# Patient Record
Sex: Female | Born: 1935 | Race: White | Hispanic: No | Smoking: Never smoker
Health system: Southern US, Community
[De-identification: ages and names within clinical notes are randomized; demographics above are authoritative.]

## PROBLEM LIST (undated history)

## (undated) DIAGNOSIS — E079 Disorder of thyroid, unspecified: Secondary | ICD-10-CM

## (undated) DIAGNOSIS — R296 Repeated falls: Secondary | ICD-10-CM

## (undated) DIAGNOSIS — E876 Hypokalemia: Secondary | ICD-10-CM

## (undated) DIAGNOSIS — R41841 Cognitive communication deficit: Secondary | ICD-10-CM

## (undated) DIAGNOSIS — F0390 Unspecified dementia without behavioral disturbance: Secondary | ICD-10-CM

## (undated) DIAGNOSIS — I739 Peripheral vascular disease, unspecified: Secondary | ICD-10-CM

## (undated) DIAGNOSIS — K219 Gastro-esophageal reflux disease without esophagitis: Secondary | ICD-10-CM

## (undated) DIAGNOSIS — F0280 Dementia in other diseases classified elsewhere without behavioral disturbance: Secondary | ICD-10-CM

## (undated) DIAGNOSIS — G309 Alzheimer's disease, unspecified: Secondary | ICD-10-CM

## (undated) DIAGNOSIS — R279 Unspecified lack of coordination: Secondary | ICD-10-CM

## (undated) DIAGNOSIS — N390 Urinary tract infection, site not specified: Secondary | ICD-10-CM

## (undated) DIAGNOSIS — F329 Major depressive disorder, single episode, unspecified: Secondary | ICD-10-CM

## (undated) DIAGNOSIS — T148XXA Other injury of unspecified body region, initial encounter: Secondary | ICD-10-CM

## (undated) DIAGNOSIS — R1310 Dysphagia, unspecified: Secondary | ICD-10-CM

## (undated) HISTORY — PX: HEMORROIDECTOMY: SUR656

---

## 2000-08-03 ENCOUNTER — Ambulatory Visit (HOSPITAL_COMMUNITY): Admission: RE | Admit: 2000-08-03 | Discharge: 2000-08-03 | Payer: Self-pay | Admitting: Internal Medicine

## 2000-08-03 ENCOUNTER — Encounter: Payer: Self-pay | Admitting: Internal Medicine

## 2000-10-01 ENCOUNTER — Other Ambulatory Visit: Admission: RE | Admit: 2000-10-01 | Discharge: 2000-10-01 | Payer: Self-pay | Admitting: Dermatology

## 2001-04-09 ENCOUNTER — Other Ambulatory Visit: Admission: RE | Admit: 2001-04-09 | Discharge: 2001-04-09 | Payer: Self-pay | Admitting: Internal Medicine

## 2001-04-10 ENCOUNTER — Encounter: Payer: Self-pay | Admitting: Internal Medicine

## 2001-04-10 ENCOUNTER — Ambulatory Visit (HOSPITAL_COMMUNITY): Admission: RE | Admit: 2001-04-10 | Discharge: 2001-04-10 | Payer: Self-pay | Admitting: Internal Medicine

## 2001-04-18 ENCOUNTER — Encounter: Payer: Self-pay | Admitting: Internal Medicine

## 2001-04-18 ENCOUNTER — Ambulatory Visit (HOSPITAL_COMMUNITY): Admission: RE | Admit: 2001-04-18 | Discharge: 2001-04-18 | Payer: Self-pay | Admitting: Internal Medicine

## 2001-05-02 ENCOUNTER — Encounter: Payer: Self-pay | Admitting: Internal Medicine

## 2001-05-02 ENCOUNTER — Ambulatory Visit (HOSPITAL_COMMUNITY): Admission: RE | Admit: 2001-05-02 | Discharge: 2001-05-02 | Payer: Self-pay | Admitting: Internal Medicine

## 2001-05-09 ENCOUNTER — Encounter (HOSPITAL_COMMUNITY): Admission: RE | Admit: 2001-05-09 | Discharge: 2001-06-08 | Payer: Self-pay | Admitting: Rheumatology

## 2001-06-05 ENCOUNTER — Ambulatory Visit (HOSPITAL_COMMUNITY): Admission: RE | Admit: 2001-06-05 | Discharge: 2001-06-05 | Payer: Self-pay | Admitting: Internal Medicine

## 2001-06-11 ENCOUNTER — Ambulatory Visit (HOSPITAL_COMMUNITY): Admission: RE | Admit: 2001-06-11 | Discharge: 2001-06-11 | Payer: Self-pay | Admitting: Internal Medicine

## 2001-08-05 ENCOUNTER — Encounter: Payer: Self-pay | Admitting: Internal Medicine

## 2001-08-05 ENCOUNTER — Ambulatory Visit (HOSPITAL_COMMUNITY): Admission: RE | Admit: 2001-08-05 | Discharge: 2001-08-05 | Payer: Self-pay | Admitting: Internal Medicine

## 2002-04-02 ENCOUNTER — Observation Stay (HOSPITAL_COMMUNITY): Admission: RE | Admit: 2002-04-02 | Discharge: 2002-04-03 | Payer: Self-pay | Admitting: Internal Medicine

## 2002-08-06 ENCOUNTER — Encounter: Payer: Self-pay | Admitting: Internal Medicine

## 2002-08-06 ENCOUNTER — Ambulatory Visit (HOSPITAL_COMMUNITY): Admission: RE | Admit: 2002-08-06 | Discharge: 2002-08-06 | Payer: Self-pay | Admitting: Internal Medicine

## 2002-09-26 ENCOUNTER — Ambulatory Visit (HOSPITAL_COMMUNITY): Admission: RE | Admit: 2002-09-26 | Discharge: 2002-09-26 | Payer: Self-pay | Admitting: Pulmonary Disease

## 2003-08-06 ENCOUNTER — Ambulatory Visit (HOSPITAL_COMMUNITY): Admission: RE | Admit: 2003-08-06 | Discharge: 2003-08-06 | Payer: Self-pay | Admitting: Family Medicine

## 2004-07-26 ENCOUNTER — Ambulatory Visit: Payer: Self-pay | Admitting: Internal Medicine

## 2004-08-08 ENCOUNTER — Ambulatory Visit (HOSPITAL_COMMUNITY): Admission: RE | Admit: 2004-08-08 | Discharge: 2004-08-08 | Payer: Self-pay | Admitting: Internal Medicine

## 2005-08-10 ENCOUNTER — Ambulatory Visit (HOSPITAL_COMMUNITY): Admission: RE | Admit: 2005-08-10 | Discharge: 2005-08-10 | Payer: Self-pay | Admitting: Pulmonary Disease

## 2005-08-15 ENCOUNTER — Ambulatory Visit: Payer: Self-pay | Admitting: Internal Medicine

## 2005-08-22 ENCOUNTER — Ambulatory Visit (HOSPITAL_COMMUNITY): Admission: RE | Admit: 2005-08-22 | Discharge: 2005-08-22 | Payer: Self-pay | Admitting: Pulmonary Disease

## 2006-01-06 ENCOUNTER — Emergency Department (HOSPITAL_COMMUNITY): Admission: EM | Admit: 2006-01-06 | Discharge: 2006-01-06 | Payer: Self-pay | Admitting: Emergency Medicine

## 2006-10-16 ENCOUNTER — Ambulatory Visit (HOSPITAL_COMMUNITY): Admission: RE | Admit: 2006-10-16 | Discharge: 2006-10-16 | Payer: Self-pay | Admitting: Pulmonary Disease

## 2006-11-26 ENCOUNTER — Ambulatory Visit: Payer: Self-pay | Admitting: Internal Medicine

## 2006-12-17 ENCOUNTER — Encounter: Payer: Self-pay | Admitting: Internal Medicine

## 2006-12-17 ENCOUNTER — Ambulatory Visit (HOSPITAL_COMMUNITY): Admission: RE | Admit: 2006-12-17 | Discharge: 2006-12-17 | Payer: Self-pay | Admitting: Internal Medicine

## 2006-12-17 ENCOUNTER — Ambulatory Visit: Payer: Self-pay | Admitting: Internal Medicine

## 2007-03-29 ENCOUNTER — Ambulatory Visit (HOSPITAL_COMMUNITY): Admission: RE | Admit: 2007-03-29 | Discharge: 2007-03-29 | Payer: Self-pay | Admitting: Pulmonary Disease

## 2007-04-05 ENCOUNTER — Ambulatory Visit (HOSPITAL_COMMUNITY): Admission: RE | Admit: 2007-04-05 | Discharge: 2007-04-05 | Payer: Self-pay | Admitting: Pulmonary Disease

## 2007-10-22 ENCOUNTER — Ambulatory Visit (HOSPITAL_COMMUNITY): Admission: RE | Admit: 2007-10-22 | Discharge: 2007-10-22 | Payer: Self-pay | Admitting: Pulmonary Disease

## 2008-03-02 ENCOUNTER — Ambulatory Visit (HOSPITAL_COMMUNITY): Admission: RE | Admit: 2008-03-02 | Discharge: 2008-03-02 | Payer: Self-pay | Admitting: Pulmonary Disease

## 2008-03-16 ENCOUNTER — Ambulatory Visit (HOSPITAL_COMMUNITY): Admission: RE | Admit: 2008-03-16 | Discharge: 2008-03-16 | Payer: Self-pay | Admitting: Pulmonary Disease

## 2008-10-26 ENCOUNTER — Ambulatory Visit (HOSPITAL_COMMUNITY): Admission: RE | Admit: 2008-10-26 | Discharge: 2008-10-26 | Payer: Self-pay | Admitting: Pulmonary Disease

## 2009-04-12 ENCOUNTER — Encounter: Payer: Self-pay | Admitting: Urgent Care

## 2009-10-28 ENCOUNTER — Ambulatory Visit (HOSPITAL_COMMUNITY): Admission: RE | Admit: 2009-10-28 | Discharge: 2009-10-28 | Payer: Self-pay | Admitting: Pulmonary Disease

## 2010-02-28 ENCOUNTER — Encounter: Payer: Self-pay | Admitting: Pulmonary Disease

## 2010-03-08 NOTE — Medication Information (Signed)
Summary: Tax adviser   Imported By: Diana Eves 04/12/2009 13:08:08  _____________________________________________________________________  External Attachment:    Type:   Image     Comment:   External Document  Appended Document: RX Folder Pt not seen in >2 yrs.  Needs OV 1st or can get from PCP.  Appended Document: RX Folder pt says that she has already gotten her meds and doesn't want to make an appt.

## 2010-06-21 NOTE — H&P (Signed)
NAMEJERIE, BASFORD                 ACCOUNT NO.:  [id number]   MEDICAL RECORD NO.:  [id number]         PATIENT TYPE:  PAMB   LOCATION:  day                           FACILITY:  [facility]   PHYSICIAN:  R. Roetta Sessions, M.D. DATE OF BIRTH:  [birth year]   DATE OF ADMISSION:  DATE OF DISCHARGE:                              HISTORY & PHYSICAL   CHIEF COMPLAINT:  Need high risk screening colonoscopy and family  history of colon cancer; Sore in right buttock area.   Ms. Belvia Gotschall. Leicht is a very pleasant 77 year old Caucasian female with  a positive family history of colon cancer in multiple first degree  relatives who is here to set up high risk screening exam.  She tells me  she has noted a problem with a red painful sore area in her right  buttock area which has been present for the past week.  She does not  recall any trauma or bug bites, etc.  She has not had any melena or  rectal bleeding. Bowel function described as normal.  She last underwent  a colonoscopy in 2003 which demonstrated only internal hemorrhoids.  She  has a history of gastroesophageal reflux disease, nonspecific motility  disorder.  She has been maintained on omeprazole 20 mg b.i.d. with good  control of symptoms more recently.  She had gained to 149-1/2 pounds in  July of last year when she was seen and now weighs 144 pounds.  She does  not have any early satiety, nausea, vomiting, odynophagia or dysphagia.  She really denies any abdominal pain.   PAST MEDICAL HISTORY:  1. Significant for gastroesophageal reflux disease.  2. Nonspecific esophageal motility disorder.  3. History of hypothyroidism.  4. Seasonal allergies.   PAST SURGICAL HISTORY:  1. Endometriosis status post bilateral salpingo-oophorectomy.  2. Hysterectomy.  3. Hemorrhoidectomy.  4. Thyroid surgery.  5. Bladder repair.   She was evaluated for dyspnea by Dr. Valera Castle with no significant  coronary artery disease found.   CURRENT MEDICATIONS:  1. Synthroid 88 mcg daily.  2. Aspirin 81 mg daily.  3. Ocuvite two tablets b.i.d.  4. Vitamin D daily.  5. Indocin 25 mg t.i.d.  6. Hydrochlorothiazide 50 mg daily.  7. Omeprazole 20 b.i.d.   ALLERGIES:  PENICILLIN, KEFLEX, MOTRIN.   FAMILY HISTORY:  Positive for colon cancer in two brothers and sister,  questionably of her mother.   SOCIAL HISTORY:  The patient does not smoke. She is married and has two  sons.  She rarely consumes alcohol. Her husband just fractured his left  hip and is in rehab, this has been quite a challenge for her, taking  care of him.   REVIEW OF SYMPTOMS:  As in history of present illness.  No fever,  chills, no chest pain, dyspnea on exertion.   PHYSICAL EXAMINATION:  GENERAL:  Pleasant 77 year old lady who appears  at her baseline.  VITAL SIGNS:  Weight 144, height 5 feet, 5 inches. Temperature 98.3,  blood pressure 112/82, pulse 68.  SKIN:  Warm and dry. No jaundice.  HEENT EXAM:  No scleral  icterus, conjunctivae are pink.  CHEST:  Lungs are clear to auscultation.  CARDIAC EXAM:  Regular rate and rhythm without murmurs, rubs, or  gallops.  ABDOMEN:  Nondistended, positive bowel sounds, soft, nontender, no  appreciable mass or organomegaly.  EXTREMITIES:  No edema.  BUTTOCKS:  Examination reveals a large erythematous area on her medial  right buttock extending down towards her anus, it is golf ball size,  firmness within the center, it is tender to palpation but no exquisitely  so. I do not see any break in the skin.  DIGITAL RECTAL EXAM:  Reveals no unusual anal or rectal tenderness,  there is no mass in the rectal vault. Scant brown stool is Hemoccult  negative.   ASSESSMENT:  Ms. Birkland is a pleasant 77 year old lady who needs high  risk screening colonoscopy, given multiple first degree relatives with  colon cancer.  I have discussed this approach with her, the potential  risks, benefits and alternatives have been reviewed.  Will set up   screening colonoscopy in the very near future.   To me she appears to have significant right buttock cellulitis, may have  an abscess.  I told her she needs to go ahead and get this addressed and  would be better with her seeing a surgeon prior to our setting up  colonoscopy.  To this end I put a call to Dr. Tilford Pillar to see if he  can see her in the very near future.  Further recommendations to follow.      Jonathon Bellows, M.D.  Electronically Signed     RMR/MEDQ  D:  11/26/2006  T:  11/26/2006  Job:  [id number]   cc:   Tilford Pillar, MD   Oneal Deputy. Juanetta Gosling, M.D.  Fax: [phone]

## 2010-06-21 NOTE — Op Note (Signed)
Kristin, [name]                 ACCOUNT NO.:  [id number]   MEDICAL RECORD NO.:  [id number]          PATIENT TYPE:  AMB   LOCATION:  DAY                           FACILITY:  [facility]   PHYSICIAN:  R. Roetta Sessions, M.D. DATE OF BIRTH:  03-22-1930   DATE OF PROCEDURE:  12/17/2006  DATE OF DISCHARGE:                               OPERATIVE REPORT   COLONOSCOPY WITH BIOPSY:   INDICATIONS FOR PROCEDURE:  A 78 year old lady with positive family  history of colon cancer.   Last colonoscopy in 2003 demonstrated only some internal hemorrhoids.  She recently saw Dr. Lovell Sheehan for a gluteal abscess.  Colonoscopy is now  being done as a high-risk remainder.  This approach has been discussed  with the patient at length.  Potential risks, benefits and alternatives  have been reviewed, questions answered.  She is agreeable.  Please see  the documentation in the medical record.   PROCEDURE NOTE:  O2 saturation, blood pressure, pulse and respirations  were monitored throughout the entire procedure.  Conscious sedation:  Versed 6 mg IV, Demerol mg IV in divided doses.  Instrument:  Pentax  video chip system.   FINDINGS:  Digital rectal exam revealed no abnormalities.  Endoscopic  findings:  The prep was adequate.   Colon:  Colonic mucosa was surveyed from the rectosigmoid junction  through the left, transverse, right colon to the area of the appendiceal  orifice, ileocecal valve and cecum.  These structures were well-seen and  photographed for record.  From this level the scope was slowly withdrawn  and all previously-mentioned mucosal surfaces were again seen.  At the  splenic flexure there was a 4-mm flat, adenomatous-appearing lesion.  It  was subtle, but please see photos.  It was removed entirely with cold  biopsy forceps technique and recovered for the pathologist.  The  remainder of the colonic mucosa appeared normal.  The scope was pulled  down into the rectum, where a thorough examination  of the rectal mucosa  including a retroflexed view of the anal verge demonstrated no  abnormalities.  The patient tolerated the procedure well and was reacted  in endoscopy.   IMPRESSION:  1. Normal rectum.  2. Flat, adenomatous-appearing polyp, splenic flexure, removed      described above.  3. Otherwise normal colon.   RECOMMENDATIONS:  1. Follow up on pathology.  2. Further recommendations to follow.      Jonathon Bellows, M.D.  Electronically Signed     RMR/MEDQ  D:  12/17/2006  T:  12/17/2006  Job:  [id number]   cc:   Ramon Dredge L. Juanetta Gosling, M.D.  Fax: [phone]

## 2010-06-24 NOTE — Op Note (Signed)
[facility]  Patient:    DURU, REIGER Visit Number: [id number] MRN: [id number]          Service Type: END Location: DAY Attending Physician:  Jonathon Bellows Dictated by:   Roetta Sessions, M.D. Admit Date:  06/05/2001   CC:         Jeanmarie Hubert, M.D.   Operative Report  PROCEDURE:  EGD with [facility] dilation, followed by diagnostic colonoscopy.  INDICATIONS FOR PROCEDURE:  The patient is a 77 year old lady with intermittent esophageal dysphagia and reflux.  She takes Prevacid.  Also, intermittent rectal bleeding.  EGD and colonoscopy are now being done to further evaluate her symptoms.  The approach has been discussed with the patient previously.  The potential risks, benefits, and alternatives have been reviewed.  Please see my dictated H and P from May 21, 2001, for more information.  PROCEDURE NOTE:  O2 saturation, blood pressure, pulse, and respirations were monitored throughout the entirety of both procedures.  CONSCIOUS SEDATION:  Versed 5 mg IV, Demerol 100 mg IV in divided doses.  INSTRUMENTS:  Olympus adult gastroscope and colonoscope.  FINDINGS:  EGD examination of the tubular esophagus revealed no mucosal abnormalities.  The EG junction was easily traversed into the stomach. The gastric cavity was emptied and insufflated well with air.  Thorough examination of the gastric mucosa including the retroflexed view of the proximal stomach and esophagogastric junction demonstrated no abnormalities. The pylorus was patent and easily traversed.  DUODENUM:  Bulb and second portion appeared normal.  THERAPEUTIC/DIAGNOSTIC MANEUVERS PERFORMED:  A 56 French Maloney dilator was passed to full insertion with good patient tolerance.  A look back revealed a slight amount of blood at the EG junction but no apparent complication.  The patient tolerated the procedure well and was prepared for colonoscopy. Digital rectal examination revealed no  abnormalities.  ENDOSCOPIC FINDINGS: The prep was good.  RECTUM: Examination of the rectal mucosa including a retroflexed view at the anal verge revealed only internal hemorrhoids.  COLON:  The colonic mucosa was surveyed from the rectosigmoid junction through the left transverse, right colon, to the area of the appendiceal orifice, ileocecal valve, and cecum.  These structures were well-seen and photographed. No colonic mucosal abnormalities were noted upon advancement of the scope to the cecum.  From the level of the cecum and ileocecal valve, the scope was slowly withdrawn.  All previously mentioned mucosal surfaces were again seen, and no other abnormalities were observed.  The patient tolerated the procedure well and was reacted in endoscopy.  IMPRESSION: 1. Normal rectum except for internal hemorrhoids. 2. Normal colon.  EGD FINDINGS:  Normal esophagus, stomach, and duodenum through the second portion ______ passage of a 64 French Maloney dilator.  It was suspected that the patient has bled from hemorrhoids.  RECOMMENDATION:  Hemorrhoid literature was provided to Ms. Camacho.  ______ course of Anusol suppositories 1 per rectum at bedtime.  As far as her dysphagia is concerned, I am concerned that she has an underlying esophageal motility disorder (i.e., early achalasia).  Will continue Prevacid 30 mg orally daily for the time being.  I will ask her to go ahead and undergo an esophageal motility study to see if we can shed more light on her dysphagia.  Further recommendations to follow. Dictated by:   Roetta Sessions, M.D. Attending Physician:  Jonathon Bellows DD:  06/05/01 TD:  06/05/01 Job: [id number]

## 2010-06-24 NOTE — Consult Note (Signed)
[facility]  Patient:    [name], [name] Visit Number: [id number] MRN: [id number]          Service Type: OUT Location: RAD Attending Physician:  Arna Snipe Dictated by:   Nathaneil Canary, M.D. Proc. Date: 05/09/01 Admit Date:  05/02/2001 Discharge Date: 05/02/2001                            Consultation Report  REFERRING PHYSICIAN:  Duard Larsen., M.D. in [place], [place].  CHIEF COMPLAINT:  Skin.  HISTORY OF PRESENT ILLNESS:  Thank you for this consultation.  [name] is a 77 year old white female with whom I worked with two office visits in December 1996 and March 1997.  [name] had an usual skin rash and easy bruising at that time.  [name] had a negative ANA at that time.  Her review of systems was negative for lupus.  Between December 1996 and when [name] returned in March 1997, [name] had seen Dr. Reche Dixon who diagnosed solar purpura.  That was the last time I saw her until today.  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  Vasculitis was not identified.  Over the last year, [name] has had return of skin problems that [name] says are different than what was going on in 1996.  Some of these areas are quite red, some having itching, but generally there is little pruritus.  These occur in nonsun exposed areas typically at this point.  [name] has had no fever or weight loss.  [name] has arthritis to her back and some swollen fingers.  [name] denies oral ulcers, pleuritic chest pain, significant alopecia or thinning.  [name] denies Raynauds or shortness of breath.  PAST MEDICAL HISTORY:  Hemorrhoidectomy.  Hysterectomy.  Thyroid surgery in 1984.  Bladder tack in 1996.  MEDICATIONS:  1. Zyrtec 1 b.i.d.  2. Prevacid 30 mg b.i.d.  3. Synthroid 0.112 mg q.d.  4. Premarin 0.625 mg q.d.  5. Flonase q.d.  6. Baby aspirin q.d.  7. Valium 5 mg p.r.n.  8. Tylenol  p.r.n.  9. Vitamins, minerals. 10. Ultravate 0.05% b.i.d. 11. Citravate/ketoconazole 1:1 cream b.i.d.  PHYSICAL EXAMINATION:  VITAL SIGNS:  Weight 160 pounds.  Blood pressure 112/64, respirations 16.  GENERAL:  No distress.  SKIN:  [name] has some erythema that is patchy and the worse area is to the left buttocks which is about 10 by 10 cm.  This is read.  There is a slight scaly appearance.  This does not appear to be classic psoriasis.  There are smaller patches in the lower abdomen and around the umbilicus.  There is a slight patch on the left leg.  On the forearm and upper arms, it is nonspecific graininess to the texture of the arms.  There was a slight red patch to the right upper arm.  HEENT:  Essentially normal hair pattern for age.  PERRLA.  EOMI.  Mouth clear.  NECK:  Negative JVD.  HEART:  Regular.  No murmur.  ABDOMEN:  Negative HSN.  Nontender.  MUSCULOSKELETAL:  The hands show slight DIP hypertrophy.  The left third PIP is quite swollen and tender.  The MCPs were not swollen and nontender. Wrists, elbows, shoulders, neck, back, hips, knees, ankles, and feet have a good range of motion and were nontender, except for the back mildly in the lower area.  NEUROLOGICAL:  Nonfocal.  ASSESSMENT/PLAN: 1. Dermatitis:  I do not strongly suspect a polymyositis or dermatomyositis    but I will check the liver/muscle enzymes along with a CK and LDH.  I    will also repeat an ANA which was negative in the past.  We will try to    obtain the laboratories obtained from April 09, 2001.  We are setting her    up for a return appointment to Dr. Reche Dixon at [facility].  I have    discussed with Ms. Bacchi that I really am not sure what her type of    lesion is.  I believe having her evaluated by Dr. Reche Dixon is the best    choice here. 2. Hypothyroidism. 3. Osteoarthritis:  I really have not dealt with her concerning this.  If in    the future [name] is interested in coming back  for further evaluation for    this, which I have not done today, I would be glad to work with her.    [name] will return on a p.r.n. basis. Dictated by:   Nathaneil Canary, M.D. Attending Physician:  Arna Snipe DD:  05/09/01 TD:  05/10/01 Job: [id number]

## 2010-06-24 NOTE — H&P (Signed)
[facility]  Patient:    [name], [name] Visit Number: [id number] MRN: [id number]          Service Type: RHE Location: SPCL Attending Physician:  Donnal Moat Dictated by:   Roetta Sessions, M.D. Admit Date:  05/09/2001   CC:         Arna Snipe, M.D.   History and Physical  DATE OF BIRTH:  Nov 03, 1930  CHIEF COMPLAINT:  Intermittent esophageal dysphagia, recent nausea, hematochezia.  HISTORY OF PRESENT ILLNESS:  [name] is a 77 year old Velasquez who has had a one-year history of intermittent hematochezia.  [name] also was taking Celebrex earlier Kristin year and developed some nausea which subsided with cessation of Celebrex.  [name] saw Dr. Leona Carry.  [name] described undergoing a HIDA scan with fatty meal challenge, and [name] was told that [name] had a dysfunctional gallbladder.  The patient has had plans for an elective cholecystectomy, but because of other medical problems, [name] has seen numerous physicians recently, and Kristin has been put off.  [name] has seen Dr. Tamera Stands for a possible laryngopharyngeal reflux.  He increased her Prevacid to 30 mg orally b.i.d. [name] saw Dr. Margo Aye for skin lesion, and biopsy revealed possible vasculitis. [name] ultimately saw Dr. Loleta Chance, an allergist.  [name] is being seen by Dr. Kellie Simmering for some rheumatologic problems.  [name] reports small volume gross blood per rectum intemrittently over the past one year.  [name] also reports intermittent esophageal dysphagia.  Her past GI history is significant for a prior history of dysphagia and rectal bleeding.  [name] has a positive family history for colon cancer.  [name] underwent an EGD and colonoscopy on September 15, 1996.  [name] was found to have internal hemorrhoids, otherwise normal rectum and colon colonoscopy. EGD demonstrated superficial antral erosions.  No esophageal abnormality; however, a 56-French Maloney dilator was passed.  Kristin did help her dysphagia for a year  or so.  [name] had a barium esophagogram for recurrent dysphagia in October 1999 which demonstrated diffusely impaired esophageal motility.  A 12.5 mm barium tablet had lodged in the esophagogastric junction.  [name] subsequently underwent a repeat EGD on December 01, 1997, which revealed no discrete ring, stricture, or other abnormality.  [name] responded to [facility] dilator once again.  CLO testing was negative.  [name] ultimately was found to have positive H. pylori serology. [name] was treated with Prilosec, Biaxin, and Flagyl for 10 days.  Followup examination with barium esophagogram on April 21, 1998, demonstrated tapered narrowing of the distal esophagus several centimeters below a small sliding hernia, most consistent with a peptic stricture.  A 13 mm barium pill again lodged in Kristin area.  PAST MEDICAL HISTORY:  Significant for endometriosis, status post bilateral salpingo-oophorectomy, hysterectomy, hemorrhoidectomy, thyroid surgery, bladder repair.  [name] also has a history of hypothyroidism, seasonal allergies. [name] underwent a CT scan of her chest to follow up on the barium esophagogram.  No extrinsic lesion was found.  It is notable that esophageal manometry was performed on March 22, 1999, which revealed an elevated esophageal body, ampulla, prolongation of ampulla in a setting consistent with a nonspecific esophageal motility disorder. There was no evidence for achalasia.  [name] had a cardiac evaluation back in November 2001 for exertional dyspnea.  [name] was seen by Dr. Daleen Squibb.  Apparently no significant abnormalities were found.  CURRENT MEDICATIONS:  1. Synthroid 112 mcg daily.  2. Premarin 0.625 mg daily.  3. Diuril 250 mg every other day.  4. ASA 1 mg daily.  5. Vitamins A and D daily.  6. Vitamin C daily.  7. Calcium 3 times daily.  8. Flonase daily.  9. Valium 5 mg p.r.n. 10. Selenium 200 mcg daily. 11. Prevacid 30 mg orally b.i.d. 12. [name] was on  Celebrex.  ALLERGIES:  PENICILLIN and KEFLEX.  FAMILY HISTORY:  Markedly positive for colorectal cancer in two brothers and a sister.  SOCIAL HISTORY:  The patient does not smoke.  [name] is married and has two sons. [name] does like some alcohol.  REVIEW OF SYSTEMS:  There has been no melena.  Rectal bleeding as noted above. [name] notes no constipation or diarrhea.  PHYSICAL EXAMINATION:  GENERAL:  A pleasant 77 year old Velasquez resting comfortably.  VITAL SIGNS:  Weight 159.75.  Blood pressure 130/70, pulse 70.  SKIN:  Warm and dry with no jaundice.  HEENT:  No scleral icterus.  NECK:  JVD is not prominent.  CHEST:  Lungs clear to auscultation.  HEART:  Regular rate and rhythm without murmur, gallop, or rub.  ABDOMEN:  Nondistended.  Positive bowel sounds.  Soft and nontender without appreciable mass or organomegaly.  DIAGNOSTIC DATA:  Laboratory evaluation on April 08, 2001, Dr. Leona Carry, LFTs looked good; they were all normal.  Serum iron normal at 119.  White count 6.5, hemoglobin 14, hematocrit 40.1.  TSH 0.033, T4 14.8.  Cholesterol up to 239, HDL 41, LDL 143.  IMPRESSION:  [name] is a pleasant 77 year old Velasquez with recurrent esophageal dysphagia.  [name] has never been found to have a tight stricture or ring on endoscopy.  [name] may have an early evolving esophageal motility disorder.  At any rate, [name] has recurrent dysphagia and needs to have an upper endoscopy with probable esophageal dilation.  Hematochezia is a separate issue.  There were reassuring findings at colonoscopy a few years ago.  [name] has a marked positive family history of colorectal neoplasia.  [name] needs to have a colonoscopy now.  I discussed the approach with EGD and probable esophageal dilation and colonoscopy with [name].  Potential risks, benefits, and alternatives have been reviewed.  Her questions are answered.  [name] is agreeable.  [name] is low  risk for conscious sedation with Versed  and Demerol.  Will plan to perform upper and lower endoscopy in the near future at [facility].  Further recommendations to follow. Dictated by:   Roetta Sessions, M.D. Attending Physician:  Donnal Moat DD:  05/21/01 TD:  05/21/01 Job: [id number]

## 2010-06-24 NOTE — Procedures (Signed)
   NAME:  [name], [name]                           ACCOUNT NO.:  [id number]   MEDICAL RECORD NO.:  [id number]                   PATIENT TYPE:  AMB   LOCATION:  DAY                                  FACILITY:  [facility]   PHYSICIAN:  Vida Roller, M.D.                DATE OF BIRTH:  [birth year]   DATE OF PROCEDURE:  DATE OF DISCHARGE:                                EKG INTERPRETATION   INTERPRETATION:  Electrocardiogram shows normal sinus rhythm at a rate of 78  with evidence of right bundle branch block and left anterior fascicular  block.  There is otherwise normal PR interval and a normal QT interval.  There is no obvious evidence of acute myocardial infarction or ischemia.                                               Vida Roller, M.D.    JH/MEDQ  D:  03/31/2002  T:  03/31/2002  Job:  [id number]

## 2010-06-24 NOTE — Op Note (Signed)
[facility]  Patient:    [name], [name] Visit Number: [id number] MRN: [id number]          Service Type: END Location: DAY Attending Physician:  Jonathon Bellows Dictated by:   Tana Coast, P.A. Proc. Date: 06/11/01 Admit Date:  06/11/2001 Discharge Date: 06/11/2001                             Operative Report  DATE OF BIRTH:  08-29-1930  PROCEDURES:  Esophageal manometry.  REFERRING PHYSICIAN:  Roetta Sessions, M.D.  INDICATIONS:  Intermittent esophageal dysphagia, questionable underlying esophageal motility disorder.  PRIOR STUDIES:  EGD on June 05, 2001, revealed normal exam.  A 56 French Maloney dilator was passed empirically which revealed a slight amount of blood noted at the EG junction.  METHOD:  The esophageal manometry study with ______ medical gastroesophageal, upper gastrointestinal polygram, version 6.40, using Arndorfer infusion system at 0.5 cc per minute.  The lower esophageal sphincter (LES) was identified in four ports with the standard station pull-through technique.  For esophageal body pressures, the tip of the catheter was placed 3 cm above the proximal aspect of the LES.  Ten wet swallows were taken with the ports 5 cm apart in the esophageal body.  This procedure was reviewed with the patient prior to performing it.  FINDINGS: Esophageal body: 13 cm 8 cm 3 cm  Mean (distal two ports) Amplitude (mmHg) 38 to 102 49 to 131 64 to 154 59 to 139  Patient 24.2 123.5 215.3 169.4  Duration (seconds) 3 to 5 3 to 5 2.5 to 3.5 3 to 5  Patient 5.4 8.6 10.3 9.4  Contractions (with wet swallows) peristaltic. Lower esophageal sphincter (LES) Located at 45 to 41 cm from nares. Resting pressure 16.2 mmHg. Relaxation adequate.  IMPRESSION:  Elevated esophageal body amplitude and prolonged amplitude.  Does not meet criteria for nutrcrackers esophagus.  Patient in nonspecific esophageal motility disorder  category.  No evidence of achalasia.  RECOMMENDATIONS:  Would correlate findings clinically.  Patient recently underwent empirical dilatation of the esophagus. Dictated by:   Tana Coast, P.A. Attending Physician:  Jonathon Bellows DD:  06/20/01 TD:  06/24/01 Job: [id number]

## 2010-06-24 NOTE — Op Note (Signed)
NAME:  [name], [name]                           ACCOUNT NO.:  [id number]   MEDICAL RECORD NO.:  [id number]                   PATIENT TYPE:  AMB   LOCATION:  DAY                                  FACILITY:  [facility]   PHYSICIAN:  Bernerd Limbo. Leona Carry, M.D.             DATE OF BIRTH:  02-Dec-1930   DATE OF PROCEDURE:  04/02/2002  DATE OF DISCHARGE:                                 OPERATIVE REPORT   PREOPERATIVE DIAGNOSIS:  Chronic cholecystitis.   POSTOPERATIVE DIAGNOSIS:  Chronic cholecystitis.   OPERATION PERFORMED:  Laparoscopic cholecystectomy.   COMPLICATIONS:  None.   SURGEON:  Bernerd Limbo. Destefano, M.D.   DESCRIPTION OF PROCEDURE:  Under adequate general anesthesia, patient  prepped and draped in the usual manner, and a small incision was made above  the umbilicus.  The skin was then elevated and the Veress needle inserted  into the opening into the peritoneal cavity, abdomen insufflated with 4-5  liters of carbon dioxide at 10-15 mm of pressure.  The #10 trocar was  inserted with the Visiport attached and through this opening, the video  camera was inserted.  Small incision was made about 2 cm below and to the  right of the xiphoid and through this opening, a 10 mm trocar was inserted.  Through the upper 10 mm port, the dissecting instrument was inserted.  Two  small incisions were made on the right, the first about 2 cm below the  lowest rib, the second 2 cm below and lateral to this, and through these  openings, 5 mm trocars were inserted.  Through the 5 mm ports, retracting  instruments were inserted.  The gallbladder was grasped at the tip and at  the infundibulum.  There was a moderate amount of fatty tissue adherent.  By  means of blunt dissection, this was freed up.  Dissection was carried down  to the level of the infundibulum.  By means of blunt dissection, the cystic  artery was identified, mobilized, triply clamped distally, singly, clamped  proximally, and transected.  A  cystic artery was identified, mobilized,  doubly clipped distally, singly clipped proximally, and transected.  Then  utilizing electrocautery, the gallbladder was removed from below upwards  with minimal difficulty.  A large piece of Surgicel was placed in the  gallbladder bed.  All bleeding appeared to be under control at this portion  of the operation.  The gallbladder was then completely removed from the  gallbladder bed and removed through the upper 10 mm port.  The peritoneum  was irrigated with saline and the fluid aspirated.  At the completion of the  procedure, all bleeding appeared to be under control.  All of the  instruments were removed, and the abdomen was decompressed.  The fascial  layers were closed with interrupted #1 Vicryl sutures.  The skin incisions  were then closed with skin clips.  Telfa and OpSite dressing applied.  The  patient tolerated the procedure nicely and left in good condition.                                               Bernerd Limbo. Leona Carry, M.D.    NMD/MEDQ  D:  04/02/2002  T:  04/02/2002  Job:  [id number]

## 2010-11-04 ENCOUNTER — Other Ambulatory Visit (HOSPITAL_COMMUNITY): Payer: Self-pay | Admitting: Pulmonary Disease

## 2010-11-04 DIAGNOSIS — Z139 Encounter for screening, unspecified: Secondary | ICD-10-CM

## 2010-11-15 ENCOUNTER — Ambulatory Visit (HOSPITAL_COMMUNITY)
Admission: RE | Admit: 2010-11-15 | Discharge: 2010-11-15 | Disposition: A | Discharge status: Home / Self Care | Payer: Medicare Other | Source: Ambulatory Visit | Attending: Pulmonary Disease | Admitting: Pulmonary Disease

## 2010-11-15 DIAGNOSIS — Z1231 Encounter for screening mammogram for malignant neoplasm of breast: Secondary | ICD-10-CM | POA: Insufficient documentation

## 2010-11-15 DIAGNOSIS — Z139 Encounter for screening, unspecified: Secondary | ICD-10-CM

## 2011-01-06 ENCOUNTER — Emergency Department (HOSPITAL_COMMUNITY): Payer: Medicare Other

## 2011-01-06 ENCOUNTER — Emergency Department (HOSPITAL_COMMUNITY)
Admission: EM | Admit: 2011-01-06 | Discharge: 2011-01-06 | Disposition: A | Discharge status: Home / Self Care | Payer: Medicare Other | Attending: Emergency Medicine | Admitting: Emergency Medicine

## 2011-01-06 DIAGNOSIS — M545 Low back pain, unspecified: Secondary | ICD-10-CM | POA: Insufficient documentation

## 2011-01-06 DIAGNOSIS — S0101XA Laceration without foreign body of scalp, initial encounter: Secondary | ICD-10-CM

## 2011-01-06 DIAGNOSIS — E039 Hypothyroidism, unspecified: Secondary | ICD-10-CM | POA: Insufficient documentation

## 2011-01-06 DIAGNOSIS — S0100XA Unspecified open wound of scalp, initial encounter: Secondary | ICD-10-CM | POA: Insufficient documentation

## 2011-01-06 DIAGNOSIS — R51 Headache: Secondary | ICD-10-CM | POA: Insufficient documentation

## 2011-01-06 DIAGNOSIS — M5030 Other cervical disc degeneration, unspecified cervical region: Secondary | ICD-10-CM | POA: Insufficient documentation

## 2011-01-06 DIAGNOSIS — S0990XA Unspecified injury of head, initial encounter: Secondary | ICD-10-CM | POA: Insufficient documentation

## 2011-01-06 DIAGNOSIS — W1789XA Other fall from one level to another, initial encounter: Secondary | ICD-10-CM | POA: Insufficient documentation

## 2011-01-06 DIAGNOSIS — M25559 Pain in unspecified hip: Secondary | ICD-10-CM | POA: Insufficient documentation

## 2011-01-06 HISTORY — DX: Disorder of thyroid, unspecified: E07.9

## 2011-01-06 MED ORDER — ONDANSETRON 4 MG PO TBDP
4.0000 mg | ORAL_TABLET | Freq: Once | ORAL | Status: AC
  Administered 2011-01-06: 4 mg via ORAL
  Filled 2011-01-06: qty 1

## 2011-01-06 NOTE — ED Provider Notes (Signed)
History     CSN: [id number] Arrival date & time: 01/06/2011  1:18 PM   First MD Initiated Contact with Patient 01/06/11 1323      Chief Complaint  Patient presents with  . Fall    (Consider location/radiation/quality/duration/timing/severity/associated sxs/prior treatment) Patient is a 77 y.o. female presenting with fall. The history is provided by a relative (The patient's son states that the patient fell today and hit her head no loss of consciousness).  Fall The accident occurred 1 to 2 hours ago. The fall occurred while walking. She fell from a height of 1 to 2 ft. She landed on a hard floor. The point of impact was the head. The pain is present in the head. The pain is at a severity of 3/10. The pain is mild. She was not ambulatory at the scene. There was no drug use involved in the accident. Associated symptoms include headaches. Pertinent negatives include no abdominal pain, no bowel incontinence and no hematuria. The symptoms are aggravated by activity.    Past Medical History  Diagnosis Date  . Thyroid disease     History reviewed. No pertinent past surgical history.  History reviewed. No pertinent family history.  History  Substance Use Topics  . Smoking status: Not on file  . Smokeless tobacco: Not on file  . Alcohol Use: No    OB History    Grav Para Term Preterm Abortions TAB SAB Ect Mult Living                  Review of Systems  Constitutional: Negative for fatigue.  HENT: Negative for congestion, sinus pressure and ear discharge.   Eyes: Negative for discharge.  Respiratory: Negative for cough.   Cardiovascular: Negative for chest pain.  Gastrointestinal: Negative for abdominal pain, diarrhea and bowel incontinence.  Genitourinary: Negative for frequency and hematuria.  Musculoskeletal: Negative for back pain.  Skin: Negative for rash.  Neurological: Positive for headaches. Negative for seizures.  Hematological: Negative.   Psychiatric/Behavioral:  Negative for hallucinations.    Allergies  Review of patient's allergies indicates no known allergies.  Home Medications   Current Outpatient Rx  Name Route Sig Dispense Refill  . LEVOTHYROXINE SODIUM 88 MCG PO TABS Oral Take 88 mcg by mouth daily.        BP 154/79  Pulse 75  Temp(Src) 97.7 F (36.5 C) (Oral)  Resp 19  Ht 5\' 7"  (1.702 m)  SpO2 99%  Physical Exam  Constitutional: She appears well-developed.  HENT:  Head: Normocephalic.       Lac to scalp post.  3 cm  Eyes: Conjunctivae and EOM are normal. No scleral icterus.  Neck: Neck supple. No thyromegaly present.  Cardiovascular: Normal rate and regular rhythm.  Exam reveals no gallop and no friction rub.   No murmur heard. Pulmonary/Chest: No stridor. She has no wheezes. She has no rales. She exhibits no tenderness.  Abdominal: She exhibits no distension. There is no tenderness. There is no rebound.  Musculoskeletal: Normal range of motion. She exhibits no edema.  Lymphadenopathy:    She has no cervical adenopathy.  Neurological: Coordination normal.       Pt alert pt oriented to place and person only.  Son states this is her normal  Skin: No rash noted. No erythema.  Psychiatric: She has a normal mood and affect. Her behavior is normal.    ED Course  LACERATION REPAIR Performed by: Drury Ardizzone L Authorized by: Bethann Berkshire L Comments: The patient  had a 3 cm laceration to her scalp. The area was cleaned thoroughly with Betadine. 5 staples were used to close the laceration. The patient tolerated the procedure well   (including critical care time)  Labs Reviewed - No data to display Dg Lumbar Spine Complete  01/06/2011  *RADIOLOGY REPORT*  Clinical Data: Low back and right hip pain post fall  LUMBAR SPINE - COMPLETE 4+ VIEW  Comparison: 03/02/2008  Findings: Five non-rib bearing lumbar vertebrae. Osseous demineralization. Diffuse disc space narrowing, greatest at L1-L2 and L2-L3. Vertebral body heights  maintained without fracture or subluxation. No bone destruction or spondylolysis. Minimal facet degenerative changes lower lumbar spine. SI joints symmetric.  IMPRESSION: Osseous demineralization. Degenerative disc disease changes, greatest at L1-L2 and L2-L3, increased since previous exam. No definite acute bony abnormalities.  Original Report Authenticated By: Lollie Marrow, M.D.   Dg Hip Complete Right  01/06/2011  *RADIOLOGY REPORT*  Clinical Data: Low back and right hip pain, fall  RIGHT HIP - COMPLETE 2+ VIEW  Comparison: None  Findings: Diffuse osseous demineralization. Minimal narrowing of the left hip joint. SI joints symmetric. Tiny pelvic phleboliths. No acute fracture, dislocation, or bone destruction.  IMPRESSION: Osseous demineralization. No acute bony abnormalities.  Original Report Authenticated By: Lollie Marrow, M.D.   Ct Head Wo Contrast  01/06/2011  *RADIOLOGY REPORT*  Clinical Data:  Fall striking back of head, posterior head pain, nausea, vomiting  CT HEAD WITHOUT CONTRAST CT CERVICAL SPINE WITHOUT CONTRAST  Technique:  Multidetector CT imaging of the head and cervical spine was performed following the standard protocol without intravenous contrast.  Multiplanar CT image reconstructions of the cervical spine were also generated.  Comparison:  CT head 03/16/2008  CT HEAD  Findings: Generalized atrophy. Normal ventricular morphology. No midline shift or mass effect. Small focus of high attenuation identified at right frontal region compatible with a tiny hemorrhagic contusion versus minimal subarachnoid blood in the sulcus. Minimal small vessel chronic ischemic changes of deep cerebral white matter. No additional intracranial hemorrhage, mass lesion, or evidence of acute infarction. Small right occipital scalp hematoma. Visualized paranasal sinuses and mastoid air cells clear. Skull intact.  IMPRESSION: Atrophy with small vessel chronic ischemic changes of deep cerebral white matter. Tiny  focus of high attenuation hemorrhage at right frontal region, either a small hemorrhagic contusion in a frontal gyrus or a small amount of subarachnoid blood in a right frontal sulcus.  CT CERVICAL SPINE  Findings: Osseous demineralization. Prevertebral soft tissues normal thickness. Disc space narrowing and endplate spur formation throughout cervical spine. Minor facet degenerative changes. Encroachment upon bilateral cervical neural foramina by uncovertebral spurs. Vertebral body heights maintained without fracture, subluxation, or bone destruction. Visualized skull base intact. Minimal biapical lung scarring. No acute osseous findings.  IMPRESSION:  Osseous demineralization. Multilevel degenerative disc disease changes of the cervical spine as above. No definite acute cervical spine abnormalities.  Critical Value/emergent results were called by telephone at the time of interpretation on 01/06/2011  at 1455 hrs  to  Dr. Deretha Emory, who verbally acknowledged these results.  Original Report Authenticated By: Lollie Marrow, M.D.   Ct Cervical Spine Wo Contrast  01/06/2011  *RADIOLOGY REPORT*  Clinical Data:  Fall striking back of head, posterior head pain, nausea, vomiting  CT HEAD WITHOUT CONTRAST CT CERVICAL SPINE WITHOUT CONTRAST  Technique:  Multidetector CT imaging of the head and cervical spine was performed following the standard protocol without intravenous contrast.  Multiplanar CT image reconstructions of the cervical spine were  also generated.  Comparison:  CT head 03/16/2008  CT HEAD  Findings: Generalized atrophy. Normal ventricular morphology. No midline shift or mass effect. Small focus of high attenuation identified at right frontal region compatible with a tiny hemorrhagic contusion versus minimal subarachnoid blood in the sulcus. Minimal small vessel chronic ischemic changes of deep cerebral white matter. No additional intracranial hemorrhage, mass lesion, or evidence of acute infarction. Small  right occipital scalp hematoma. Visualized paranasal sinuses and mastoid air cells clear. Skull intact.  IMPRESSION: Atrophy with small vessel chronic ischemic changes of deep cerebral white matter. Tiny focus of high attenuation hemorrhage at right frontal region, either a small hemorrhagic contusion in a frontal gyrus or a small amount of subarachnoid blood in a right frontal sulcus.  CT CERVICAL SPINE  Findings: Osseous demineralization. Prevertebral soft tissues normal thickness. Disc space narrowing and endplate spur formation throughout cervical spine. Minor facet degenerative changes. Encroachment upon bilateral cervical neural foramina by uncovertebral spurs. Vertebral body heights maintained without fracture, subluxation, or bone destruction. Visualized skull base intact. Minimal biapical lung scarring. No acute osseous findings.  IMPRESSION:  Osseous demineralization. Multilevel degenerative disc disease changes of the cervical spine as above. No definite acute cervical spine abnormalities.  Critical Value/emergent results were called by telephone at the time of interpretation on 01/06/2011  at 1455 hrs  to  Dr. Deretha Emory, who verbally acknowledged these results.  Original Report Authenticated By: Lollie Marrow, M.D.     1. Head injury   2. Laceration of scalp       MDM  I spoke to the neurosurgeon who stated the patient could go home the small bleed in her head can be followed as an outpatient        [name], MD 01/06/11 [phone]

## 2011-01-06 NOTE — ED Notes (Signed)
Pt tripped over something in parking lot a [employer], fell striking back of head. Per EMS, pt has lac to back of head. Bleeding controlled.

## 2011-01-06 NOTE — ED Notes (Signed)
Patient was assisted from the bathroom to her room. Patient was out of breath and could not help herself off the toilet. Patient states that her head is hurting her.

## 2011-01-06 NOTE — ED Notes (Signed)
Pt return from x-ray. Complain of nausea. edp aware

## 2011-01-06 NOTE — ED Notes (Signed)
Pt removed from backboard by edp 

## 2011-01-06 NOTE — ED Notes (Signed)
Family here. Dr Estell Harpin in room talking with family

## 2011-10-08 ENCOUNTER — Encounter (HOSPITAL_COMMUNITY): Payer: Self-pay | Admitting: Emergency Medicine

## 2011-10-08 ENCOUNTER — Emergency Department (HOSPITAL_COMMUNITY): Payer: Medicare Other

## 2011-10-08 ENCOUNTER — Inpatient Hospital Stay (HOSPITAL_COMMUNITY)
Admission: EM | Admit: 2011-10-08 | Discharge: 2011-10-13 | DRG: 690 | Disposition: A | Discharge status: Home Health | Payer: Medicare Other | Attending: Pulmonary Disease | Admitting: Pulmonary Disease

## 2011-10-08 DIAGNOSIS — K219 Gastro-esophageal reflux disease without esophagitis: Secondary | ICD-10-CM | POA: Diagnosis not present

## 2011-10-08 DIAGNOSIS — N390 Urinary tract infection, site not specified: Principal | ICD-10-CM | POA: Diagnosis present

## 2011-10-08 DIAGNOSIS — R000 Tachycardia, unspecified: Secondary | ICD-10-CM | POA: Diagnosis present

## 2011-10-08 DIAGNOSIS — F0280 Dementia in other diseases classified elsewhere without behavioral disturbance: Secondary | ICD-10-CM | POA: Diagnosis present

## 2011-10-08 DIAGNOSIS — N179 Acute kidney failure, unspecified: Secondary | ICD-10-CM | POA: Diagnosis present

## 2011-10-08 DIAGNOSIS — E876 Hypokalemia: Secondary | ICD-10-CM | POA: Diagnosis present

## 2011-10-08 DIAGNOSIS — A498 Other bacterial infections of unspecified site: Secondary | ICD-10-CM | POA: Diagnosis present

## 2011-10-08 DIAGNOSIS — I959 Hypotension, unspecified: Secondary | ICD-10-CM

## 2011-10-08 DIAGNOSIS — G309 Alzheimer's disease, unspecified: Secondary | ICD-10-CM | POA: Diagnosis present

## 2011-10-08 DIAGNOSIS — E039 Hypothyroidism, unspecified: Secondary | ICD-10-CM | POA: Diagnosis not present

## 2011-10-08 LAB — CBC WITH DIFFERENTIAL/PLATELET
Eosinophils Absolute: 0 10*3/uL (ref 0.0–0.7)
Eosinophils Relative: 0 % (ref 0–5)
Hemoglobin: 13.3 g/dL (ref 12.0–15.0)
Lymphs Abs: 0.5 10*3/uL — ABNORMAL LOW (ref 0.7–4.0)
MCH: 29.8 pg (ref 26.0–34.0)
MCV: 87.7 fL (ref 78.0–100.0)
Monocytes Relative: 3 % (ref 3–12)
Platelets: 178 10*3/uL (ref 150–400)
RBC: 4.47 MIL/uL (ref 3.87–5.11)

## 2011-10-08 LAB — URINALYSIS, ROUTINE W REFLEX MICROSCOPIC
Nitrite: POSITIVE — AB
Specific Gravity, Urine: 1.015 (ref 1.005–1.030)
pH: 6 (ref 5.0–8.0)

## 2011-10-08 LAB — URINE MICROSCOPIC-ADD ON

## 2011-10-08 LAB — COMPREHENSIVE METABOLIC PANEL
BUN: 15 mg/dL (ref 6–23)
Calcium: 8.7 mg/dL (ref 8.4–10.5)
GFR calc Af Amer: 48 mL/min — ABNORMAL LOW (ref >90)
Glucose, Bld: 144 mg/dL — ABNORMAL HIGH (ref 70–99)
Total Protein: 6.5 g/dL (ref 6.0–8.3)

## 2011-10-08 MED ORDER — POTASSIUM CHLORIDE 20 MEQ PO PACK: 40.0000 meq | PACK | Freq: Once | ORAL | Status: DC

## 2011-10-08 MED ORDER — CIPROFLOXACIN IN D5W 400 MG/200ML IV SOLN
400.0000 mg | Freq: Once | INTRAVENOUS | Status: AC
  Administered 2011-10-08: 400 mg via INTRAVENOUS
  Filled 2011-10-08: qty 200

## 2011-10-08 MED ORDER — ONDANSETRON HCL 4 MG/2ML IJ SOLN
4.0000 mg | Freq: Once | INTRAMUSCULAR | Status: AC
  Administered 2011-10-08: 4 mg via INTRAVENOUS
  Filled 2011-10-08: qty 2

## 2011-10-08 MED ORDER — SODIUM CHLORIDE 0.9 % IV BOLUS (SEPSIS)
500.0000 mL | Freq: Once | INTRAVENOUS | Status: AC
  Administered 2011-10-08: 500 mL via INTRAVENOUS

## 2011-10-08 MED ORDER — POTASSIUM CHLORIDE CRYS ER 20 MEQ PO TBCR
40.0000 meq | EXTENDED_RELEASE_TABLET | Freq: Once | ORAL | Status: AC
  Administered 2011-10-08: 40 meq via ORAL
  Filled 2011-10-08: qty 2

## 2011-10-08 NOTE — ED Notes (Signed)
Patient with large brown stool in underwear.  Patient cleaned; underwear cut and thrown away per patient.

## 2011-10-08 NOTE — ED Provider Notes (Signed)
History   Scribed for Kristin Lennert, MD, the patient was seen in room [facility] . This chart was scribed by Lewanda Rife.   CSN: [id number]  Arrival date & time 10/08/11  2014   First MD Initiated Contact with Patient 10/08/11 2104      Chief Complaint  Patient presents with  . Weakness  . Fever    (Consider location/radiation/quality/duration/timing/severity/associated sxs/prior Treatment) [name] is a 78 y.o. female who presents to the Emergency Department complaining of constant diffuse weakness 1 to 2 hours prior to arrival.  Patient is a 78 y.o. female presenting with fever and weakness. The history is provided by the patient and a relative.  Fever Primary symptoms of the febrile illness include fever and vomiting. Primary symptoms do not include fatigue, headaches, cough, abdominal pain, diarrhea, altered mental status or rash. Primary symptoms comment: diffuse weakness  Weakness The primary symptoms include dizziness, fever and vomiting. Primary symptoms do not include headaches, syncope, loss of consciousness, altered mental status or seizures. Primary symptoms comment: diffuse weakness The symptoms began 1 to 2 hours ago. The symptoms are improving. The neurological symptoms are diffuse.  The dizziness began today. The dizziness has been resolved since its onset. Dizziness also occurs with vomiting and weakness.   The fever began today. The maximum temperature recorded prior to her arrival was 102 to 102.9 F. The temperature was taken by an oral thermometer.  Additional symptoms include weakness. Additional symptoms do not include hallucinations.   [name] is a 78 y.o. female who presents to the Emergency Department complaining of weakness since 2 hours prior to arrival.  Past Medical History  Diagnosis Date  . Thyroid disease     History reviewed. No pertinent past surgical history.  No family history on file.  History  Substance Use Topics  .  Smoking status: Not on file  . Smokeless tobacco: Not on file  . Alcohol Use: No    OB History    Grav Para Term Preterm Abortions TAB SAB Ect Mult Living                  Review of Systems  Constitutional: Positive for fever and chills. Negative for appetite change and fatigue.  HENT: Negative for congestion, sore throat, rhinorrhea, sinus pressure and ear discharge.   Eyes: Negative for discharge.  Respiratory: Negative for cough.   Cardiovascular: Negative for chest pain and syncope.  Gastrointestinal: Positive for vomiting. Negative for abdominal pain and diarrhea.  Genitourinary: Negative for frequency and hematuria.  Musculoskeletal: Negative for back pain.  Skin: Negative for rash.  Neurological: Positive for dizziness, tremors and weakness. Negative for seizures, loss of consciousness, syncope, speech difficulty and headaches.  Hematological: Negative.   Psychiatric/Behavioral: Negative for hallucinations and altered mental status.  All other systems reviewed and are negative.    Allergies  Penicillins  Home Medications   Current Outpatient Rx  Name Route Sig Dispense Refill  . IBUPROFEN 200 MG PO TABS Oral Take 400 mg by mouth every 8 (eight) hours as needed. pain    . LEVOTHYROXINE SODIUM 88 MCG PO TABS Oral Take 88 mcg by mouth daily.        BP 123/55  Pulse 84  Temp 102.4 F (39.1 C) (Oral)  Resp 22  Wt 170 lb (77.111 kg)  SpO2 97%  Physical Exam  Nursing note and vitals reviewed. Constitutional: She is oriented to person, place, and time. She appears well-developed.  HENT:  Head: Normocephalic and atraumatic.  Eyes: Conjunctivae and EOM are normal. No scleral icterus.  Neck: Normal range of motion. Neck supple. No thyromegaly present.  Cardiovascular: Normal rate and regular rhythm.  Exam reveals no gallop and no friction rub.   No murmur heard. Pulmonary/Chest: Effort normal. No stridor. She has no wheezes. She has no rales. She exhibits no  tenderness.  Abdominal: She exhibits no distension. There is no tenderness. There is no rebound.  Musculoskeletal: Normal range of motion. She exhibits no edema.  Lymphadenopathy:    She has no cervical adenopathy.  Neurological: She is alert and oriented to person, place, and time. Coordination normal.  Skin: No rash noted. No erythema.       Small ecchymosis noted on the right side of forehead   Psychiatric: She has a normal mood and affect. Her behavior is normal.    ED Course  Procedures (including critical care time)   Labs Reviewed  CBC WITH DIFFERENTIAL  COMPREHENSIVE METABOLIC PANEL  URINALYSIS, ROUTINE W REFLEX MICROSCOPIC   No results found.   No diagnosis found.    MDM        The chart was scribed for me under my direct supervision.  I personally performed the history, physical, and medical decision making and all procedures in the evaluation of this patient.Kristin Lennert, MD 10/08/11 [phone]

## 2011-10-08 NOTE — ED Notes (Signed)
CRITICAL VALUE ALERT  Critical value received:  K+ 2.6  Date of notification:  10/08/2011  Time of notification:  2225 Critical value read back:yes  Nurse who received alert: Arrie Eastern, RN MD notified:  Dr. Estell Harpin  Time: 2225

## 2011-10-08 NOTE — ED Notes (Signed)
Patient presents to ER via EMS with c/o seizure-like activity.  Patient vomited en route.

## 2011-10-09 ENCOUNTER — Encounter (HOSPITAL_COMMUNITY): Payer: Self-pay | Admitting: *Deleted

## 2011-10-09 DIAGNOSIS — N179 Acute kidney failure, unspecified: Secondary | ICD-10-CM | POA: Diagnosis present

## 2011-10-09 DIAGNOSIS — N390 Urinary tract infection, site not specified: Principal | ICD-10-CM

## 2011-10-09 DIAGNOSIS — I959 Hypotension, unspecified: Secondary | ICD-10-CM

## 2011-10-09 DIAGNOSIS — E876 Hypokalemia: Secondary | ICD-10-CM | POA: Diagnosis present

## 2011-10-09 DIAGNOSIS — E039 Hypothyroidism, unspecified: Secondary | ICD-10-CM | POA: Diagnosis not present

## 2011-10-09 DIAGNOSIS — K219 Gastro-esophageal reflux disease without esophagitis: Secondary | ICD-10-CM | POA: Diagnosis not present

## 2011-10-09 LAB — BASIC METABOLIC PANEL
Chloride: 101 mEq/L (ref 96–112)
Creatinine, Ser: 1.07 mg/dL (ref 0.50–1.10)
GFR calc Af Amer: 56 mL/min — ABNORMAL LOW (ref >90)
Sodium: 136 mEq/L (ref 135–145)

## 2011-10-09 LAB — CBC
HCT: 39.1 % (ref 36.0–46.0)
Platelets: 168 10*3/uL (ref 150–400)
RBC: 4.39 MIL/uL (ref 3.87–5.11)
RDW: 14.2 % (ref 11.5–15.5)
WBC: 17.3 10*3/uL — ABNORMAL HIGH (ref 4.0–10.5)

## 2011-10-09 LAB — MAGNESIUM: Magnesium: 1.9 mg/dL (ref 1.5–2.5)

## 2011-10-09 MED ORDER — MORPHINE SULFATE 2 MG/ML IJ SOLN
1.0000 mg | INTRAMUSCULAR | Status: DC | PRN
  Administered 2011-10-10: 1 mg via INTRAVENOUS
  Filled 2011-10-09 (×2): qty 1

## 2011-10-09 MED ORDER — ONDANSETRON HCL 4 MG/2ML IJ SOLN: 4.0000 mg | Freq: Four times a day (QID) | INTRAMUSCULAR | Status: DC | PRN

## 2011-10-09 MED ORDER — ACETAMINOPHEN 325 MG PO TABS
650.0000 mg | ORAL_TABLET | Freq: Four times a day (QID) | ORAL | Status: DC | PRN
  Administered 2011-10-11 – 2011-10-13 (×7): 650 mg via ORAL
  Filled 2011-10-09 (×6): qty 2

## 2011-10-09 MED ORDER — ACETAMINOPHEN 650 MG RE SUPP: 650.0000 mg | Freq: Four times a day (QID) | RECTAL | Status: DC | PRN

## 2011-10-09 MED ORDER — DOCUSATE SODIUM 100 MG PO CAPS
100.0000 mg | ORAL_CAPSULE | Freq: Two times a day (BID) | ORAL | Status: DC
  Administered 2011-10-09 – 2011-10-13 (×10): 100 mg via ORAL
  Filled 2011-10-09 (×10): qty 1

## 2011-10-09 MED ORDER — LEVOTHYROXINE SODIUM 88 MCG PO TABS
88.0000 ug | ORAL_TABLET | Freq: Every day | ORAL | Status: DC
  Administered 2011-10-09 – 2011-10-13 (×5): 88 ug via ORAL
  Filled 2011-10-09 (×8): qty 1

## 2011-10-09 MED ORDER — ONDANSETRON HCL 4 MG PO TABS: 4.0000 mg | ORAL_TABLET | Freq: Four times a day (QID) | ORAL | Status: DC | PRN

## 2011-10-09 MED ORDER — ALUM & MAG HYDROXIDE-SIMETH 200-200-20 MG/5ML PO SUSP: 30.0000 mL | Freq: Four times a day (QID) | ORAL | Status: DC | PRN

## 2011-10-09 MED ORDER — CIPROFLOXACIN IN D5W 400 MG/200ML IV SOLN
400.0000 mg | Freq: Two times a day (BID) | INTRAVENOUS | Status: DC
  Administered 2011-10-09 – 2011-10-10 (×3): 400 mg via INTRAVENOUS
  Filled 2011-10-09 (×5): qty 200

## 2011-10-09 MED ORDER — ENOXAPARIN SODIUM 30 MG/0.3ML ~~LOC~~ SOLN
30.0000 mg | SUBCUTANEOUS | Status: DC
  Administered 2011-10-09 – 2011-10-10 (×2): 30 mg via SUBCUTANEOUS
  Filled 2011-10-09 (×2): qty 0.3

## 2011-10-09 MED ORDER — SODIUM CHLORIDE 0.9 % IV SOLN
INTRAVENOUS | Status: AC
  Administered 2011-10-09: 03:00:00 via INTRAVENOUS

## 2011-10-09 NOTE — ED Notes (Signed)
Voided large amount incontinent in her bed.  Cleaned with Aloe vesta, bedding changed, vs rechecked.  Warm blanket to patient.

## 2011-10-09 NOTE — ED Notes (Signed)
Family left to go home   Son states call him if patient should become confused or for any need  Ph [phone] .  States he left his mothers glasses here with her

## 2011-10-09 NOTE — ED Notes (Signed)
IV removed and second site established.  Required two attempts - patient initially cooperative, but upon IV stick jerked arm away.  Answers questions and does not comprehend what RN is attempting.  Tech in to assist with holding arm and IV established.  Patient sleeping at intervals when not disturbed. Has her glasses on her face.

## 2011-10-09 NOTE — H&P (Signed)
[facility] History and Physical  [name] ZOX:[id number] DOB: October 08, 1930 DOA: 10/08/2011  Referring physician: EDP Zammit PCP: Fredirick Maudlin, MD   Chief Complaint: Fever, Chills  HPI: [name] is a 78 y.o. female with PMH significant only for hypothyroidism and GERD who presented to the ED complaining of "shaking spell" and weakness that began this evening - son thought she had a fever and was slightly confused so he brought her in for evaluation. She denies any dysuria or frequency but her son says for the past few days he has noticed that her urine has a very strong odor and appeared very dark. He encouraged her to drink more water since she had no pain or other complaints. The patient now complains of nausea and bilateral flank pain. In ED she was febrile, mildly hypotensive and has mild renal failure on labs.    Review of Systems: Review of Systems  Constitutional: Positive for fever, chills and malaise/fatigue. Negative for weight loss and diaphoresis.  HENT: Negative for congestion and sore throat.   Eyes: Negative for blurred vision.  Respiratory: Negative for cough and shortness of breath.   Cardiovascular: Negative for chest pain, palpitations, orthopnea and leg swelling.  Gastrointestinal: Positive for nausea and abdominal pain. Negative for heartburn, vomiting, diarrhea, constipation, blood in stool and melena.  Genitourinary: Positive for flank pain. Negative for dysuria, urgency and frequency.  Musculoskeletal: Positive for back pain. Negative for myalgias.  Skin: Negative for itching and rash.  Neurological: Positive for dizziness and weakness. Negative for sensory change, speech change, focal weakness and headaches.  Endo/Heme/Allergies: Does not bruise/bleed easily.  Psychiatric/Behavioral: Negative for depression and memory loss. The patient is not nervous/anxious and does not have insomnia.   All other systems reviewed and are negative.    Past  Medical History  Diagnosis Date  . Thyroid disease    History reviewed. No pertinent past surgical history. Social History:  does not have a smoking history on file. She does not have any smokeless tobacco history on file. She reports that she does not drink alcohol. Her drug history not on file. Lives with her son and daughter-in-law  Allergies  Allergen Reactions  . Penicillins     No family history on file. Family history positive for colon cancer.  Prior to Admission medications   Medication Sig Start Date End Date Taking? Authorizing Provider  ibuprofen (ADVIL,MOTRIN) 200 MG tablet Take 400 mg by mouth every 8 (eight) hours as needed. pain   Yes Historical Provider, MD  levothyroxine (SYNTHROID, LEVOTHROID) 88 MCG tablet Take 88 mcg by mouth daily.     Yes Historical Provider, MD   Physical Exam: Filed Vitals:   10/08/11 2029 10/08/11 2100 10/08/11 2200 10/08/11 2231  BP: 123/55 122/46 105/53 102/48  Pulse: 84 74 74 76  Temp:    99.6 F (37.6 C)  TempSrc:    Oral  Resp: 22   18  Weight:      SpO2: 97%   99%     General:  Pale, weak appearing elderly woman  Eyes: PERRL  ENT: Dry MM  Neck: supple no adenopathy  Cardiovascular: RRR, no mrg  Respiratory: CTAB  Abdomen: slightly tender over suprapubic region, positive CVA tenderness  Skin: no rashes  Musculoskeletal: OA changes, no edema  Psychiatric: slightly blunted affect, HOH  Neurologic: non-focal  Labs on Admission:  Basic Metabolic Panel:  Lab 10/08/11 8119  NA 133*  K 2.6*  CL 97  CO2 24  GLUCOSE 144*  BUN 15  CREATININE 1.20*  CALCIUM 8.7  MG --  PHOS --   Liver Function Tests:  Lab 10/08/11 2127  AST 26  ALT 18  ALKPHOS 84  BILITOT 1.6*  PROT 6.5  ALBUMIN 3.2*   CBC:  Lab 10/08/11 2127  WBC 17.6*  NEUTROABS 16.6*  HGB 13.3  HCT 39.2  MCV 87.7  PLT 178   Radiological Exams on Admission: Ct Head Wo Contrast  10/08/2011  *RADIOLOGY REPORT*  Clinical Data:  Seizures,  confusion, fever, weakness  CT HEAD WITHOUT CONTRAST CT CERVICAL SPINE WITHOUT CONTRAST  Technique:  Multidetector CT imaging of the head and cervical spine was performed following the standard protocol without intravenous contrast.  Multiplanar CT image reconstructions of the cervical spine were also generated.  Comparison:  01/06/2011  CT HEAD  Findings: Generalized atrophy. Normal ventricular morphology. No midline shift or mass effect. Scattered small vessel chronic ischemic changes of deep cerebral white matter. No intracranial hemorrhage, mass lesion or evidence of acute infarction. No extra-axial fluid collections. Visualized paranasal sinuses and mastoid air cells clear. Calvaria intact.  IMPRESSION: Atrophy with small vessel chronic ischemic changes of deep cerebral white matter. No acute intracranial abnormalities. Tiny focus of high attenuation blood seen in the high right frontal region on previous exam is no longer identified.  CT CERVICAL SPINE  Findings: Diffuse osseous demineralization. Diffuse disc space narrowing and endplate spur formation throughout cervical spine. Multilevel facet degenerative changes. Prevertebral soft tissues normal thickness. Vertebral body heights maintained without fracture or subluxation. Visualized skull base intact. Lung apices clear. Soft tissues unremarkable.  IMPRESSION: Multilevel degenerative disc and facet disease changes of the cervical spine. Osseous demineralization. No acute abnormalities.   Original Report Authenticated By: Lollie Marrow, M.D.    Ct Cervical Spine Wo Contrast  10/08/2011  *RADIOLOGY REPORT*  Clinical Data:  Seizures, confusion, fever, weakness  CT HEAD WITHOUT CONTRAST CT CERVICAL SPINE WITHOUT CONTRAST  Technique:  Multidetector CT imaging of the head and cervical spine was performed following the standard protocol without intravenous contrast.  Multiplanar CT image reconstructions of the cervical spine were also generated.  Comparison:   01/06/2011  CT HEAD  Findings: Generalized atrophy. Normal ventricular morphology. No midline shift or mass effect. Scattered small vessel chronic ischemic changes of deep cerebral white matter. No intracranial hemorrhage, mass lesion or evidence of acute infarction. No extra-axial fluid collections. Visualized paranasal sinuses and mastoid air cells clear. Calvaria intact.  IMPRESSION: Atrophy with small vessel chronic ischemic changes of deep cerebral white matter. No acute intracranial abnormalities. Tiny focus of high attenuation blood seen in the high right frontal region on previous exam is no longer identified.  CT CERVICAL SPINE  Findings: Diffuse osseous demineralization. Diffuse disc space narrowing and endplate spur formation throughout cervical spine. Multilevel facet degenerative changes. Prevertebral soft tissues normal thickness. Vertebral body heights maintained without fracture or subluxation. Visualized skull base intact. Lung apices clear. Soft tissues unremarkable.  IMPRESSION: Multilevel degenerative disc and facet disease changes of the cervical spine. Osseous demineralization. No acute abnormalities.   Original Report Authenticated By: Lollie Marrow, M.D.     EKG: Independently reviewed.   Assessment/Plan Principal Problem:  *Complicated UTI (urinary tract infection) Active Problems:  Acute renal failure  Hypokalemia  Hypotension  GERD (gastroesophageal reflux disease)  Hypothyroidism   1. UTI, complicated cystitis, possible pyelonephritis, UA with TNC WBC/pyuria, elevated WBC at 17.6, fever, mild hypotension, tachycardia and CVA tenderness.  Empiric Cipro IV BID,  PCB allergic  Await Urine culture  IV fluids  Blood Cultures X2  No advanced imaging obtained to confirm pyelo, will need 10-14 days of abx  2. Hypokalemia  repleted K orally  Repeat BMET in AM  Check Mag  3. Hypothyroidism  Check TSH  Resumed home dose synthroid  4. GERD  Prn antacid, no  complaints  5. Acute Renal Failure, Serum Cr 1.2, mild, probably pre-renal, trial of fluids and repeat in AM  If worsens will check renal US   Follow I/O, stopped home NSAID, may be contributing to ARF  Code Status: Full Code Family Communication: Discussed plan of care with patient and her family at bedside Disposition Plan: Home when medically stable  Time spent: 60 min  [facility] Pager [phone]  If 7PM-7AM, please contact night-coverage [email or web address] Password [email or web address] 10/09/2011, 12:20 AM

## 2011-10-09 NOTE — ED Notes (Signed)
Transferred by ED tech to room 307

## 2011-10-10 LAB — TSH: TSH: 0.143 u[IU]/mL — ABNORMAL LOW (ref 0.350–4.500)

## 2011-10-10 MED ORDER — SODIUM CHLORIDE 0.9 % IJ SOLN
INTRAMUSCULAR | Status: AC
  Filled 2011-10-10: qty 3

## 2011-10-10 MED ORDER — ENOXAPARIN SODIUM 40 MG/0.4ML ~~LOC~~ SOLN
40.0000 mg | SUBCUTANEOUS | Status: DC
  Administered 2011-10-11 – 2011-10-13 (×3): 40 mg via SUBCUTANEOUS
  Filled 2011-10-10 (×3): qty 0.4

## 2011-10-10 MED ORDER — SODIUM CHLORIDE 0.9 % IJ SOLN
INTRAMUSCULAR | Status: AC
  Administered 2011-10-10: 10 mL
  Filled 2011-10-10: qty 3

## 2011-10-10 MED ORDER — BIOTENE DRY MOUTH MT LIQD
15.0000 mL | Freq: Two times a day (BID) | OROMUCOSAL | Status: DC
  Administered 2011-10-10 – 2011-10-13 (×7): 15 mL via OROMUCOSAL

## 2011-10-10 NOTE — Care Management Note (Signed)
    Page 1 of 2   10/13/2011     11:10:15 AM   CARE MANAGEMENT NOTE 10/13/2011  Patient:  [name], [name]   Account Number:  [id number]  Date Initiated:  10/10/2011  Documentation initiated by:  Sharrie Rothman  Subjective/Objective Assessment:   Pt admitted from home with UTI. Pt lives with a son who provides 24 hour care for pt. Pt is very confused and unable to answer questions.     Action/Plan:   CM will speak with son once he arrives to confirm discharge plans. Pt may need HH at discharge.   Anticipated DC Date:  10/13/2011   Anticipated DC Plan:  HOME W HOME HEALTH SERVICES      DC Planning Services  CM consult      Choice offered to / List presented to:          [facility] arranged  HH-1 RN  HH-10 DISEASE MANAGEMENT  HH-2 PT  HH-4 NURSE'S AIDE      HH agency  [facility]   Status of service:  Completed, signed off Medicare Important Message given?   (If response is "NO", the following Medicare IM given date fields will be blank) Date Medicare IM given:   Date Additional Medicare IM given:    Discharge Disposition:  HOME W HOME HEALTH SERVICES  Per UR Regulation:    If discussed at Long Length of Stay Meetings, dates discussed:    Comments:  10/13/11 1000 Erminie Foulks Leanord Hawking RN BSN CM [facility] set up for PT, RN, Aide. [employer] notified. 10/10/11 1349 Arlyss Queen, RN BSN CM CM spoke with pts daughter in law and the son lives with the pt. They are agreeable to [facility] if needed. Pts daughter in law stated that the pt is very independent with ADL's at home. 10/10/11 4098 Arlyss Queen, RN BSN CM

## 2011-10-10 NOTE — Progress Notes (Signed)
Subjective: She is very confused this morning. She is unaware of her surroundings but does know that she is in [place]. She's trying to get up.  Objective: Vital signs in last 24 hours: Temp:  [98 F (36.7 C)-99.3 F (37.4 C)] 98.5 F (36.9 C) (09/03 0452) Pulse Rate:  [71-76] 76  (09/03 0759) Resp:  [18-20] 18  (09/03 0452) BP: (106-127)/(63-74) 127/74 mmHg (09/03 0452) SpO2:  [97 %-98 %] 97 % (09/03 0759) Weight change:  Last BM Date: 10/09/11  Intake/Output from previous day: 09/02 0701 - 09/03 0700 In: 2830 [P.O.:720; I.V.:1710; IV Piggyback:400] Out: 2725 [Urine:2725]  PHYSICAL EXAM General appearance: alert, mild distress and Markedly confused Resp: clear to auscultation bilaterally Cardio: regular rate and rhythm, S1, S2 normal, no murmur, click, rub or gallop GI: soft, non-tender; bowel sounds normal; no masses,  no organomegaly Extremities: extremities normal, atraumatic, no cyanosis or edema  Lab Results:    Basic Metabolic Panel:  Basename 10/09/11 0507 10/09/11 0030 10/08/11 2127  NA 136 -- 133*  K 3.6 -- 2.6*  CL 101 -- 97  CO2 28 -- 24  GLUCOSE 145* -- 144*  BUN 15 -- 15  CREATININE 1.07 -- 1.20*  CALCIUM 8.1* -- 8.7  MG -- 1.9 --  PHOS -- -- --   Liver Function Tests:  [facility] 10/08/11 2127  AST 26  ALT 18  ALKPHOS 84  BILITOT 1.6*  PROT 6.5  ALBUMIN 3.2*   No results found for this basename: LIPASE:2,AMYLASE:2 in the last 72 hours No results found for this basename: AMMONIA:2 in the last 72 hours CBC:  Basename 10/09/11 0507 10/08/11 2127  WBC 17.3* 17.6*  NEUTROABS -- 16.6*  HGB 12.9 13.3  HCT 39.1 39.2  MCV 89.1 87.7  PLT 168 178   Cardiac Enzymes: No results found for this basename: CKTOTAL:3,CKMB:3,CKMBINDEX:3,TROPONINI:3 in the last 72 hours BNP: No results found for this basename: PROBNP:3 in the last 72 hours D-Dimer: No results found for this basename: DDIMER:2 in the last 72 hours CBG: No results found for this  basename: GLUCAP:6 in the last 72 hours Hemoglobin A1C: No results found for this basename: HGBA1C in the last 72 hours Fasting Lipid Panel: No results found for this basename: CHOL,HDL,LDLCALC,TRIG,CHOLHDL,LDLDIRECT in the last 72 hours Thyroid Function Tests:  Basename 10/09/11 0507  TSH 0.143*  T4TOTAL --  FREET4 --  T3FREE --  THYROIDAB --   Anemia Panel: No results found for this basename: VITAMINB12,FOLATE,FERRITIN,TIBC,IRON,RETICCTPCT in the last 72 hours Coagulation: No results found for this basename: LABPROT:2,INR:2 in the last 72 hours Urine Drug Screen: Drugs of Abuse  No results found for this basename: labopia, cocainscrnur, labbenz, amphetmu, thcu, labbarb    Alcohol Level: No results found for this basename: ETH:2 in the last 72 hours Urinalysis:  Basename 10/08/11 2126  COLORURINE YELLOW  LABSPEC 1.015  PHURINE 6.0  GLUCOSEU NEGATIVE  HGBUR LARGE*  BILIRUBINUR NEGATIVE  KETONESUR NEGATIVE  PROTEINUR 30*  UROBILINOGEN 1.0  NITRITE POSITIVE*  LEUKOCYTESUR TRACE*   Misc. Labs:  ABGS No results found for this basename: PHART,PCO2,PO2ART,TCO2,HCO3 in the last 72 hours CULTURES Recent Results (from the past 240 hour(s))  CULTURE, BLOOD (ROUTINE X 2)     Status: Normal (Preliminary result)   Collection Time   10/08/11  9:27 PM      Component Value Range Status Comment   Specimen Description Blood LEFT ANTECUBITAL   Final    Special Requests BOTTLES DRAWN AEROBIC ONLY 6CC   Final  Culture NO GROWTH <24 HRS   Final    Report Status PENDING   Incomplete   CULTURE, BLOOD (ROUTINE X 2)     Status: Normal (Preliminary result)   Collection Time   10/09/11 12:29 AM      Component Value Range Status Comment   Specimen Description Blood RIGHT ANTECUBITAL   Final    Special Requests BOTTLES DRAWN AEROBIC AND ANAEROBIC 8CC   Final    Culture NO GROWTH <24 HRS   Final    Report Status PENDING   Incomplete    Studies/Results: Ct Head Wo Contrast  10/08/2011   *RADIOLOGY REPORT*  Clinical Data:  Seizures, confusion, fever, weakness  CT HEAD WITHOUT CONTRAST CT CERVICAL SPINE WITHOUT CONTRAST  Technique:  Multidetector CT imaging of the head and cervical spine was performed following the standard protocol without intravenous contrast.  Multiplanar CT image reconstructions of the cervical spine were also generated.  Comparison:  01/06/2011  CT HEAD  Findings: Generalized atrophy. Normal ventricular morphology. No midline shift or mass effect. Scattered small vessel chronic ischemic changes of deep cerebral white matter. No intracranial hemorrhage, mass lesion or evidence of acute infarction. No extra-axial fluid collections. Visualized paranasal sinuses and mastoid air cells clear. Calvaria intact.  IMPRESSION: Atrophy with small vessel chronic ischemic changes of deep cerebral white matter. No acute intracranial abnormalities. Tiny focus of high attenuation blood seen in the high right frontal region on previous exam is no longer identified.  CT CERVICAL SPINE  Findings: Diffuse osseous demineralization. Diffuse disc space narrowing and endplate spur formation throughout cervical spine. Multilevel facet degenerative changes. Prevertebral soft tissues normal thickness. Vertebral body heights maintained without fracture or subluxation. Visualized skull base intact. Lung apices clear. Soft tissues unremarkable.  IMPRESSION: Multilevel degenerative disc and facet disease changes of the cervical spine. Osseous demineralization. No acute abnormalities.   Original Report Authenticated By: Lollie Marrow, M.D.    Ct Cervical Spine Wo Contrast  10/08/2011  *RADIOLOGY REPORT*  Clinical Data:  Seizures, confusion, fever, weakness  CT HEAD WITHOUT CONTRAST CT CERVICAL SPINE WITHOUT CONTRAST  Technique:  Multidetector CT imaging of the head and cervical spine was performed following the standard protocol without intravenous contrast.  Multiplanar CT image reconstructions of the  cervical spine were also generated.  Comparison:  01/06/2011  CT HEAD  Findings: Generalized atrophy. Normal ventricular morphology. No midline shift or mass effect. Scattered small vessel chronic ischemic changes of deep cerebral white matter. No intracranial hemorrhage, mass lesion or evidence of acute infarction. No extra-axial fluid collections. Visualized paranasal sinuses and mastoid air cells clear. Calvaria intact.  IMPRESSION: Atrophy with small vessel chronic ischemic changes of deep cerebral white matter. No acute intracranial abnormalities. Tiny focus of high attenuation blood seen in the high right frontal region on previous exam is no longer identified.  CT CERVICAL SPINE  Findings: Diffuse osseous demineralization. Diffuse disc space narrowing and endplate spur formation throughout cervical spine. Multilevel facet degenerative changes. Prevertebral soft tissues normal thickness. Vertebral body heights maintained without fracture or subluxation. Visualized skull base intact. Lung apices clear. Soft tissues unremarkable.  IMPRESSION: Multilevel degenerative disc and facet disease changes of the cervical spine. Osseous demineralization. No acute abnormalities.   Original Report Authenticated By: Lollie Marrow, M.D.     Medications:  Prior to Admission:  Prescriptions prior to admission  Medication Sig Dispense Refill  . ibuprofen (ADVIL,MOTRIN) 200 MG tablet Take 400 mg by mouth every 8 (eight) hours as needed. pain      .  levothyroxine (SYNTHROID, LEVOTHROID) 88 MCG tablet Take 88 mcg by mouth daily.         Scheduled:   . antiseptic oral rinse  15 mL Mouth Rinse BID  . ciprofloxacin  400 mg Intravenous Q12H  . docusate sodium  100 mg Oral BID  . enoxaparin (LOVENOX) injection  30 mg Subcutaneous Q24H  . levothyroxine  88 mcg Oral QAC breakfast  . sodium chloride       Continuous:   . sodium chloride 100 mL/hr at 10/09/11 1610   RUE:[id number], acetaminophen, alum & mag  hydroxide-simeth, morphine injection, ondansetron (ZOFRAN) IV, ondansetron  Assesment: She is admitted with urinary tract infection and has had marked confusion now associated with that Principal Problem:  *Complicated UTI (urinary tract infection) Active Problems:  Acute renal failure  Hypokalemia  Hypotension  GERD (gastroesophageal reflux disease)  Hypothyroidism    Plan: She's going to have a Recruitment consultant. She will continue on her current medications.    LOS: 2 days   Ethanjames Fontenot L 10/10/2011, 8:27 AM

## 2011-10-11 LAB — CBC WITH DIFFERENTIAL/PLATELET
Hemoglobin: 13.9 g/dL (ref 12.0–15.0)
Lymphs Abs: 1 10*3/uL (ref 0.7–4.0)
MCH: 29.5 pg (ref 26.0–34.0)
Monocytes Relative: 9 % (ref 3–12)
Neutro Abs: 6.4 10*3/uL (ref 1.7–7.7)
Neutrophils Relative %: 78 % — ABNORMAL HIGH (ref 43–77)
RBC: 4.71 MIL/uL (ref 3.87–5.11)

## 2011-10-11 MED ORDER — CIPROFLOXACIN HCL 250 MG PO TABS
250.0000 mg | ORAL_TABLET | Freq: Two times a day (BID) | ORAL | Status: DC
  Administered 2011-10-11 – 2011-10-13 (×5): 250 mg via ORAL
  Filled 2011-10-11 (×5): qty 1

## 2011-10-11 MED ORDER — CIPROFLOXACIN HCL 250 MG PO TABS
500.0000 mg | ORAL_TABLET | Freq: Once | ORAL | Status: AC
  Administered 2011-10-11: 500 mg via ORAL
  Filled 2011-10-11: qty 2

## 2011-10-11 NOTE — Progress Notes (Signed)
Subjective: She is still confused. She pulled IVs out on multiple occasions last night. She is a little better this morning but still disoriented to place.  Objective: Vital signs in last 24 hours: Temp:  [98 F (36.7 C)-99 F (37.2 C)] 98 F (36.7 C) (09/04 0500) Pulse Rate:  [55-70] 56  (09/04 0814) Resp:  [20] 20  (09/04 0500) BP: (109-121)/(68-78) 109/68 mmHg (09/04 0500) SpO2:  [92 %-95 %] 95 % (09/04 0814) Weight change:  Last BM Date: 10/09/11  Intake/Output from previous day: 09/03 0701 - 09/04 0700 In: 200 [IV Piggyback:200] Out: 2100 [Urine:2100]  PHYSICAL EXAM General appearance: alert and no distress Resp: clear to auscultation bilaterally Cardio: regular rate and rhythm, S1, S2 normal, no murmur, click, rub or gallop GI: soft, non-tender; bowel sounds normal; no masses,  no organomegaly Extremities: extremities normal, atraumatic, no cyanosis or edema  Lab Results:    Basic Metabolic Panel:  Basename 10/09/11 0507 10/09/11 0030 10/08/11 2127  NA 136 -- 133*  K 3.6 -- 2.6*  CL 101 -- 97  CO2 28 -- 24  GLUCOSE 145* -- 144*  BUN 15 -- 15  CREATININE 1.07 -- 1.20*  CALCIUM 8.1* -- 8.7  MG -- 1.9 --  PHOS -- -- --   Liver Function Tests:  [facility] 10/08/11 2127  AST 26  ALT 18  ALKPHOS 84  BILITOT 1.6*  PROT 6.5  ALBUMIN 3.2*   No results found for this basename: LIPASE:2,AMYLASE:2 in the last 72 hours No results found for this basename: AMMONIA:2 in the last 72 hours CBC:  Basename 10/11/11 0518 10/09/11 0507 10/08/11 2127  WBC 8.3 17.3* --  NEUTROABS 6.4 -- 16.6*  HGB 13.9 12.9 --  HCT 41.5 39.1 --  MCV 88.1 89.1 --  PLT 175 168 --   Cardiac Enzymes: No results found for this basename: CKTOTAL:3,CKMB:3,CKMBINDEX:3,TROPONINI:3 in the last 72 hours BNP: No results found for this basename: PROBNP:3 in the last 72 hours D-Dimer: No results found for this basename: DDIMER:2 in the last 72 hours CBG: No results found for this basename:  GLUCAP:6 in the last 72 hours Hemoglobin A1C: No results found for this basename: HGBA1C in the last 72 hours Fasting Lipid Panel: No results found for this basename: CHOL,HDL,LDLCALC,TRIG,CHOLHDL,LDLDIRECT in the last 72 hours Thyroid Function Tests:  Basename 10/09/11 0507  TSH 0.143*  T4TOTAL --  FREET4 --  T3FREE --  THYROIDAB --   Anemia Panel: No results found for this basename: VITAMINB12,FOLATE,FERRITIN,TIBC,IRON,RETICCTPCT in the last 72 hours Coagulation: No results found for this basename: LABPROT:2,INR:2 in the last 72 hours Urine Drug Screen: Drugs of Abuse  No results found for this basename: labopia, cocainscrnur, labbenz, amphetmu, thcu, labbarb    Alcohol Level: No results found for this basename: ETH:2 in the last 72 hours Urinalysis:  Basename 10/08/11 2126  COLORURINE YELLOW  LABSPEC 1.015  PHURINE 6.0  GLUCOSEU NEGATIVE  HGBUR LARGE*  BILIRUBINUR NEGATIVE  KETONESUR NEGATIVE  PROTEINUR 30*  UROBILINOGEN 1.0  NITRITE POSITIVE*  LEUKOCYTESUR TRACE*   Misc. Labs:  ABGS No results found for this basename: PHART,PCO2,PO2ART,TCO2,HCO3 in the last 72 hours CULTURES Recent Results (from the past 240 hour(s))  URINE CULTURE     Status: Normal (Preliminary result)   Collection Time   10/08/11  9:26 PM      Component Value Range Status Comment   Specimen Description URINE, CATHETERIZED   Final    Special Requests NONE   Final    Culture  Setup  Time 10/10/2011 03:11   Final    Colony Count PENDING   Incomplete    Culture Culture reincubated for better growth   Final    Report Status PENDING   Incomplete   CULTURE, BLOOD (ROUTINE X 2)     Status: Normal (Preliminary result)   Collection Time   10/08/11  9:27 PM      Component Value Range Status Comment   Specimen Description Blood LEFT ANTECUBITAL   Final    Special Requests BOTTLES DRAWN AEROBIC ONLY 6CC   Final    Culture NO GROWTH 1 DAY   Final    Report Status PENDING   Incomplete   CULTURE,  BLOOD (ROUTINE X 2)     Status: Normal (Preliminary result)   Collection Time   10/09/11 12:29 AM      Component Value Range Status Comment   Specimen Description Blood RIGHT ANTECUBITAL   Final    Special Requests BOTTLES DRAWN AEROBIC AND ANAEROBIC 8CC   Final    Culture NO GROWTH 1 DAY   Final    Report Status PENDING   Incomplete    Studies/Results: No results found.  Medications:  Prior to Admission:  Prescriptions prior to admission  Medication Sig Dispense Refill  . ibuprofen (ADVIL,MOTRIN) 200 MG tablet Take 400 mg by mouth every 8 (eight) hours as needed. pain      . levothyroxine (SYNTHROID, LEVOTHROID) 88 MCG tablet Take 88 mcg by mouth daily.         Scheduled:   . antiseptic oral rinse  15 mL Mouth Rinse BID  . ciprofloxacin  250 mg Oral BID  . ciprofloxacin  500 mg Oral Once  . docusate sodium  100 mg Oral BID  . enoxaparin (LOVENOX) injection  40 mg Subcutaneous Q24H  . levothyroxine  88 mcg Oral QAC breakfast  . sodium chloride      . sodium chloride      . DISCONTD: ciprofloxacin  400 mg Intravenous Q12H  . DISCONTD: enoxaparin (LOVENOX) injection  30 mg Subcutaneous Q24H   Continuous:  [id number], acetaminophen, alum & mag hydroxide-simeth, morphine injection, ondansetron (ZOFRAN) IV, ondansetron  Assesment: She has a complicated urinary tract infection. She has pulled out multiple IVs so I'm going to switch her to oral Cipro. Her culture is still pending. Principal Problem:  *Complicated UTI (urinary tract infection) Active Problems:  Acute renal failure  Hypokalemia  Hypotension  GERD (gastroesophageal reflux disease)  Hypothyroidism    Plan: Switch to oral Cipro await the urine culture    LOS: 3 days   [place] Jaquith L 10/11/2011, 8:53 AM

## 2011-10-11 NOTE — Progress Notes (Signed)
UR Chart Review Completed  

## 2011-10-11 NOTE — Clinical Documentation Improvement (Signed)
CHANGE MENTAL STATUS DOCUMENTATION CLARIFICATION   THIS DOCUMENT IS NOT A PERMANENT PART OF THE MEDICAL RECORD  TO RESPOND TO THE THIS QUERY, FOLLOW THE INSTRUCTIONS BELOW:  1. If needed, update documentation for the patient's encounter via the notes activity.  2. Access this query again and click edit on the In [employer].  3. After updating, or not, click F2 to complete all highlighted (required) fields concerning your review. Select "additional documentation in the medical record" OR "no additional documentation provided".  4. Click Sign note button.  5. The deficiency will fall out of your In Basket *Please let us know if you are not able to complete this workflow by phone or e-mail (listed below).         10/11/11  Dear Dr.  Juanetta Gosling Marton Redwood  In an effort to better capture your patient's severity of illness, reflect appropriate length of stay and utilization of resources, a review of the patient medical record has revealed the following indicators.  Based on your clinical judgment, please clarify and document in a progress note and/or discharge summary the clinical condition associated with the following supporting information.  In responding to this query please exercise your independent judgment.  The fact that a query is asked, does not imply that any particular answer is desired or expected.  Possible Clinical Conditions?  Encephalopathy (describe type if known               Metabolic               Toxic Acute confusion Acute delirium Acute exacerbation of known dementia (indicate type) New diagnosis of Dementia, Alzheimer's, cerebral atherosclerosis Other Condition__________________ Cannot Clinically Determine   Clinical Information:  Risk Factors: Diagnosis of complicated UTI Acute renal failure Hypotension Advanced age Hospitalization  Signs & Symptoms: Confusion Pulling out IVs  Diagnostics: Component     Latest Ref Rng 10/08/2011  Color, Urine  YELLOW YELLOW  APPearance     CLEAR CLOUDY (A)  Specific Gravity, Urine     1.005 - 1.030 1.015  pH     5.0 - 8.0 6.0  Glucose     NEGATIVE mg/dL NEGATIVE  Hgb urine dipstick     NEGATIVE LARGE (A)  Bilirubin Urine     NEGATIVE NEGATIVE  Ketones, ur     NEGATIVE mg/dL NEGATIVE  Protein     NEGATIVE mg/dL 30 (A)  Urobilinogen, UA     0.0 - 1.0 mg/dL 1.0  Nitrite     NEGATIVE POSITIVE (A)  Leukocytes, UA     NEGATIVE TRACE (A)   Treatment: Cipro 500mg  po x 1, then 250mg  po bid  Reviewed: additional documentation in the medical record  Thank You,  Debora T Williams RN, MSN Clinical Documentation Specialist: Office# [phone] [facility] Health Information Management [place]

## 2011-10-11 NOTE — Progress Notes (Signed)
Patient pulled out her IV at 2230. Patient was supposed to receive IV antibiotics. Three nurses and the nurse supervisor tried to get an IV. All were unsuccessful. Doctor was notified and new orders were given for a one time dose of PO Cipro and to let day shift nurse know and the attending doctor.

## 2011-10-12 LAB — URINE CULTURE

## 2011-10-12 NOTE — Progress Notes (Signed)
Subjective: She is better. shje is still  Febrile.  Objective: Vital signs in last 24 hours: Temp:  [97.3 F (36.3 C)-101.1 F (38.4 C)] 97.5 F (36.4 C) (09/05 2003) Pulse Rate:  [59-86] 70  (09/05 2003) Resp:  [16-18] 18  (09/05 2003) BP: (111-162)/(71-88) 162/88 mmHg (09/05 2003) SpO2:  [94 %-99 %] 99 % (09/05 2003) Weight change:  Last BM Date: 10/09/11  Intake/Output from previous day: 09/04 0701 - 09/05 0700 In: 630 [P.O.:630] Out: 0   PHYSICAL EXAM General appearance: alert, no distress and confused Resp: clear to auscultation bilaterally Cardio: regular rate and rhythm, S1, S2 normal, no murmur, click, rub or gallop GI: soft, non-tender; bowel sounds normal; no masses,  no organomegaly Extremities: extremities normal, atraumatic, no cyanosis or edema  Lab Results:    Basic Metabolic Panel: No results found for this basename: NA:2,K:2,CL:2,CO2:2,GLUCOSE:2,BUN:2,CREATININE:2,CALCIUM:2,MG:2,PHOS:2 in the last 72 hours Liver Function Tests: No results found for this basename: AST:2,ALT:2,ALKPHOS:2,BILITOT:2,PROT:2,ALBUMIN:2 in the last 72 hours No results found for this basename: LIPASE:2,AMYLASE:2 in the last 72 hours No results found for this basename: AMMONIA:2 in the last 72 hours CBC:  Basename 10/11/11 0518  WBC 8.3  NEUTROABS 6.4  HGB 13.9  HCT 41.5  MCV 88.1  PLT 175   Cardiac Enzymes: No results found for this basename: CKTOTAL:3,CKMB:3,CKMBINDEX:3,TROPONINI:3 in the last 72 hours BNP: No results found for this basename: PROBNP:3 in the last 72 hours D-Dimer: No results found for this basename: DDIMER:2 in the last 72 hours CBG: No results found for this basename: GLUCAP:6 in the last 72 hours Hemoglobin A1C: No results found for this basename: HGBA1C in the last 72 hours Fasting Lipid Panel: No results found for this basename: CHOL,HDL,LDLCALC,TRIG,CHOLHDL,LDLDIRECT in the last 72 hours Thyroid Function Tests: No results found for this  basename: TSH,T4TOTAL,FREET4,T3FREE,THYROIDAB in the last 72 hours Anemia Panel: No results found for this basename: VITAMINB12,FOLATE,FERRITIN,TIBC,IRON,RETICCTPCT in the last 72 hours Coagulation: No results found for this basename: LABPROT:2,INR:2 in the last 72 hours Urine Drug Screen: Drugs of Abuse  No results found for this basename: labopia, cocainscrnur, labbenz, amphetmu, thcu, labbarb    Alcohol Level: No results found for this basename: ETH:2 in the last 72 hours Urinalysis: No results found for this basename: COLORURINE:2,APPERANCEUR:2,LABSPEC:2,PHURINE:2,GLUCOSEU:2,HGBUR:2,BILIRUBINUR:2,KETONESUR:2,PROTEINUR:2,UROBILINOGEN:2,NITRITE:2,LEUKOCYTESUR:2 in the last 72 hours Misc. Labs:  ABG No results found for this basename: PHART,PCO2,PO2ART,TCO2,HCO3 in the last 72 hours CULTURES Recent Results (from the past 240 hour(s))  URINE CULTURE     Status: Normal   Collection Time   10/08/11  9:26 PM      Component Value Range Status Comment   Specimen Description URINE, CATHETERIZED   Final    Special Requests NONE   Final    Culture  Setup Time 10/10/2011 03:11   Final    Colony Count >=100,000 COLONIES/ML   Final    Culture     Final    Value: ESCHERICHIA COLI     Note: Two isolates with different morphologies were identified as the same organism.The most resistant organism was reported.   Report Status 10/12/2011 FINAL   Final    Organism ID, Bacteria ESCHERICHIA COLI   Final   CULTURE, BLOOD (ROUTINE X 2)     Status: Normal (Preliminary result)   Collection Time   10/08/11  9:27 PM      Component Value Range Status Comment   Specimen Description BLOOD LEFT ANTECUBITAL   Final    Special Requests BOTTLES DRAWN AEROBIC ONLY 6CC   Final  Culture NO GROWTH 3 DAYS   Final    Report Status PENDING   Incomplete   CULTURE, BLOOD (ROUTINE X 2)     Status: Normal (Preliminary result)   Collection Time   10/09/11 12:29 AM      Component Value Range Status Comment   Specimen  Description BLOOD RIGHT ANTECUBITAL   Final    Special Requests BOTTLES DRAWN AEROBIC AND ANAEROBIC 8CC   Final    Culture NO GROWTH 3 DAYS   Final    Report Status PENDING   Incomplete    Studies/Results: No results found.  Medications:  Prior to Admission:  Prescriptions prior to admission  Medication Sig Dispense Refill  . ibuprofen (ADVIL,MOTRIN) 200 MG tablet Take 400 mg by mouth every 8 (eight) hours as needed. pain      . levothyroxine (SYNTHROID, LEVOTHROID) 88 MCG tablet Take 88 mcg by mouth daily.         Scheduled:   . antiseptic oral rinse  15 mL Mouth Rinse BID  . ciprofloxacin  250 mg Oral BID  . docusate sodium  100 mg Oral BID  . enoxaparin (LOVENOX) injection  40 mg Subcutaneous Q24H  . levothyroxine  88 mcg Oral QAC breakfast   Continuous:  ZOX:[id number], acetaminophen, alum & mag hydroxide-simeth, morphine injection, ondansetron (ZOFRAN) IV, ondansetron  Assesment:she has a severe UTI. sjhe  Has been confused. Principal Problem:  *Complicated UTI (urinary tract infection) Active Problems:  Acute renal failure  Hypokalemia  Hypotension  GERD (gastroesophageal reflux disease)  Hypothyroidism    Plan: continue treatment. Await urine culture.    LOS: 4 days   Remy Dia L 10/12/2011, 8:45 PM

## 2011-10-13 MED ORDER — CIPROFLOXACIN HCL 250 MG PO TABS: 250.0000 mg | ORAL_TABLET | Freq: Two times a day (BID) | ORAL | Status: AC

## 2011-10-13 NOTE — Plan of Care (Signed)
Problem: Discharge Progression Outcomes Goal: Pain controlled with appropriate interventions Outcome: Completed/Met Date Met:  10/13/11 Tylenol for back pain Goal: Activity appropriate for discharge plan Outcome: Completed/Met Date Met:  10/13/11 Up in chair Goal: Other Discharge Outcomes/Goals Outcome: Completed/Met Date Met:  10/13/11 Discharge instructions read to pt and her family  Family verbalized understanding of all instructions  Discharged to home with family [facility]

## 2011-10-13 NOTE — Progress Notes (Signed)
She was admitted with a complicated urinary tract infection. She feels better. She is still mildly confused. She has been afebrile.  Exam shows that she is awake and alert and looks much more comfortable. She is still mildly confused. Her temperature is 90.8 pulse 64 respirations 20 blood pressure 142/76 and O2 sat is 95%. Her chest is clear. Her heart is regular.  She has an Escherichia coli urinary tract infection is sensitive to Cipro. I think she's okay for discharge.  Discharge home with home health services

## 2011-10-14 LAB — CULTURE, BLOOD (ROUTINE X 2): Culture: NO GROWTH

## 2011-10-15 DIAGNOSIS — F0280 Dementia in other diseases classified elsewhere without behavioral disturbance: Secondary | ICD-10-CM | POA: Diagnosis present

## 2011-10-15 NOTE — Progress Notes (Signed)
Physician Discharge Summary  Patient ID: [name] MRN: [id number] DOB/AGE: 76-Jan-1932 76 y.o. Primary Care Physician:Janei Scheff L, MD Admit date: 10/08/2011 Discharge date: 10/15/2011    Discharge Diagnoses:   Principal Problem:  *Complicated UTI (urinary tract infection) Active Problems:  Acute renal failure  Hypokalemia  Hypotension  GERD (gastroesophageal reflux disease)  Hypothyroidism  acute exacerbation of Alzheimer's type dementia  Medication List  As of 10/15/2011  9:17 AM   TAKE these medications         ciprofloxacin 250 MG tablet   Commonly known as: CIPRO   Take 1 tablet (250 mg total) by mouth 2 (two) times daily.      ibuprofen 200 MG tablet   Commonly known as: ADVIL,MOTRIN   Take 400 mg by mouth every 8 (eight) hours as needed. pain      levothyroxine 88 MCG tablet   Commonly known as: SYNTHROID, LEVOTHROID   Take 88 mcg by mouth daily.            Discharged Condition: Improved    Consults: None  Significant Diagnostic Studies: Ct Head Wo Contrast  10/08/2011  *RADIOLOGY REPORT*  Clinical Data:  Seizures, confusion, fever, weakness  CT HEAD WITHOUT CONTRAST CT CERVICAL SPINE WITHOUT CONTRAST  Technique:  Multidetector CT imaging of the head and cervical spine was performed following the standard protocol without intravenous contrast.  Multiplanar CT image reconstructions of the cervical spine were also generated.  Comparison:  01/06/2011  CT HEAD  Findings: Generalized atrophy. Normal ventricular morphology. No midline shift or mass effect. Scattered small vessel chronic ischemic changes of deep cerebral white matter. No intracranial hemorrhage, mass lesion or evidence of acute infarction. No extra-axial fluid collections. Visualized paranasal sinuses and mastoid air cells clear. Calvaria intact.  IMPRESSION: Atrophy with small vessel chronic ischemic changes of deep cerebral white matter. No acute intracranial abnormalities. Tiny focus of high  attenuation blood seen in the high right frontal region on previous exam is no longer identified.  CT CERVICAL SPINE  Findings: Diffuse osseous demineralization. Diffuse disc space narrowing and endplate spur formation throughout cervical spine. Multilevel facet degenerative changes. Prevertebral soft tissues normal thickness. Vertebral body heights maintained without fracture or subluxation. Visualized skull base intact. Lung apices clear. Soft tissues unremarkable.  IMPRESSION: Multilevel degenerative disc and facet disease changes of the cervical spine. Osseous demineralization. No acute abnormalities.   Original Report Authenticated By: Lollie Marrow, M.D.    Ct Cervical Spine Wo Contrast  10/08/2011  *RADIOLOGY REPORT*  Clinical Data:  Seizures, confusion, fever, weakness  CT HEAD WITHOUT CONTRAST CT CERVICAL SPINE WITHOUT CONTRAST  Technique:  Multidetector CT imaging of the head and cervical spine was performed following the standard protocol without intravenous contrast.  Multiplanar CT image reconstructions of the cervical spine were also generated.  Comparison:  01/06/2011  CT HEAD  Findings: Generalized atrophy. Normal ventricular morphology. No midline shift or mass effect. Scattered small vessel chronic ischemic changes of deep cerebral white matter. No intracranial hemorrhage, mass lesion or evidence of acute infarction. No extra-axial fluid collections. Visualized paranasal sinuses and mastoid air cells clear. Calvaria intact.  IMPRESSION: Atrophy with small vessel chronic ischemic changes of deep cerebral white matter. No acute intracranial abnormalities. Tiny focus of high attenuation blood seen in the high right frontal region on previous exam is no longer identified.  CT CERVICAL SPINE  Findings: Diffuse osseous demineralization. Diffuse disc space narrowing and endplate spur formation throughout cervical spine. Multilevel facet degenerative changes. Prevertebral soft  tissues normal thickness.  Vertebral body heights maintained without fracture or subluxation. Visualized skull base intact. Lung apices clear. Soft tissues unremarkable.  IMPRESSION: Multilevel degenerative disc and facet disease changes of the cervical spine. Osseous demineralization. No acute abnormalities.   Original Report Authenticated By: Lollie Marrow, M.D.     Lab Results: Basic Metabolic Panel: No results found for this basename: NA:2,K:2,CL:2,CO2:2,GLUCOSE:2,BUN:2,CREATININE:2,CALCIUM:2,MG:2,PHOS:2 in the last 72 hours Liver Function Tests: No results found for this basename: AST:2,ALT:2,ALKPHOS:2,BILITOT:2,PROT:2,ALBUMIN:2 in the last 72 hours   CBC: No results found for this basename: WBC:2,NEUTROABS:2,HGB:2,HCT:2,MCV:2,PLT:2 in the last 72 hours  Recent Results (from the past 240 hour(s))  URINE CULTURE     Status: Normal   Collection Time   10/08/11  9:26 PM      Component Value Range Status Comment   Specimen Description URINE, CATHETERIZED   Final    Special Requests NONE   Final    Culture  Setup Time 10/10/2011 03:11   Final    Colony Count >=100,000 COLONIES/ML   Final    Culture     Final    Value: ESCHERICHIA COLI     Note: Two isolates with different morphologies were identified as the same organism.The most resistant organism was reported.   Report Status 10/12/2011 FINAL   Final    Organism ID, Bacteria ESCHERICHIA COLI   Final   CULTURE, BLOOD (ROUTINE X 2)     Status: Normal   Collection Time   10/08/11  9:27 PM      Component Value Range Status Comment   Specimen Description BLOOD LEFT ANTECUBITAL   Final    Special Requests BOTTLES DRAWN AEROBIC ONLY 6CC   Final    Culture NO GROWTH 5 DAYS   Final    Report Status 10/14/2011 FINAL   Final   CULTURE, BLOOD (ROUTINE X 2)     Status: Normal   Collection Time   10/09/11 12:29 AM      Component Value Range Status Comment   Specimen Description BLOOD RIGHT ANTECUBITAL   Final    Special Requests BOTTLES DRAWN AEROBIC AND ANAEROBIC 8CC    Final    Culture NO GROWTH 5 DAYS   Final    Report Status 10/14/2011 FINAL   Final      Hospital Course: She was admitted with a complicated urinary tract infection. She had worsening renal function she was markedly confused and mildly hypotensive. She was treated with IV fluids antibiotics and improved. She remained somewhat confused. Her fever was prolonged. She was found to have Escherichia coli that was sensitive to Cipro and was placed on by mouth Cipro after she had pulled out multiple IVs. She was more alert but still mildly confused at the time of discharge was afebrile  Discharge Exam: Blood pressure 142/76, pulse 64, temperature 98 F (36.7 C), temperature source Oral, resp. rate 20, height 5\' 7"  (1.702 m), weight 71.532 kg (157 lb 11.2 oz), SpO2 95.00%. She was mildly confused but more alert her chest clear heart regular abdomen soft  Disposition: Home with home health services  Discharge Orders    Future Orders Please Complete By Expires   Home Health      Questions: Responses:   To provide the following care/treatments RN    PT   Face-to-face encounter      Comments:   I Mace Weinberg L certify that this patient is under my care and that I, or a nurse practitioner or physician's assistant working with me,  had a face-to-face encounter that meets the physician face-to-face encounter requirements with this patient on 10/13/2011.   Questions: Responses:   The encounter with the patient was in whole, or in part, for the following medical condition, which is the primary reason for home health care complicated UTI   I certify that, based on my findings, the following services are medically necessary home health services Nursing    Physical therapy   My clinical findings support the need for the above services High Risk for rehospitalization   Further, I certify that my clinical findings support that this patient is homebound due to: Mental confusion   To provide the following  care/treatments RN    PT   Discharge patient         Follow-up Information    Follow up with [facility]. (PT, RN, [place] [phone])          Signed: Fredirick Maudlin Pager [phone]  10/15/2011, 9:17 AM

## 2011-10-21 NOTE — Discharge Summary (Signed)
Physician Discharge Summary  Patient ID: [name] MRN: [id number] DOB/AGE: [birth year] 78 y.o. Primary Care Physician:Santiaga Butzin L, MD Admit date: 10/08/2011 Discharge date: 10/21/2011    Discharge Diagnoses:   Principal Problem:  *Complicated UTI (urinary tract infection) Active Problems:  Acute renal failure  Hypokalemia  Hypotension  GERD (gastroesophageal reflux disease)  Hypothyroidism  Dementia     Medication List     As of 10/21/2011  9:30 AM    TAKE these medications         ciprofloxacin 250 MG tablet   Commonly known as: CIPRO   Take 1 tablet (250 mg total) by mouth 2 (two) times daily.      ibuprofen 200 MG tablet   Commonly known as: ADVIL,MOTRIN   Take 400 mg by mouth every 8 (eight) hours as needed. pain      levothyroxine 88 MCG tablet   Commonly known as: SYNTHROID, LEVOTHROID   Take 88 mcg by mouth daily.        Discharged Condition: Improved   Consults: None  Significant Diagnostic Studies: Ct Head Wo Contrast  10/08/2011  *RADIOLOGY REPORT*  Clinical Data:  Seizures, confusion, fever, weakness  CT HEAD WITHOUT CONTRAST CT CERVICAL SPINE WITHOUT CONTRAST  Technique:  Multidetector CT imaging of the head and cervical spine was performed following the standard protocol without intravenous contrast.  Multiplanar CT image reconstructions of the cervical spine were also generated.  Comparison:  01/06/2011  CT HEAD  Findings: Generalized atrophy. Normal ventricular morphology. No midline shift or mass effect. Scattered small vessel chronic ischemic changes of deep cerebral white matter. No intracranial hemorrhage, mass lesion or evidence of acute infarction. No extra-axial fluid collections. Visualized paranasal sinuses and mastoid air cells clear. Calvaria intact.  IMPRESSION: Atrophy with small vessel chronic ischemic changes of deep cerebral white matter. No acute intracranial abnormalities. Tiny focus of high attenuation blood seen in the high  right frontal region on previous exam is no longer identified.  CT CERVICAL SPINE  Findings: Diffuse osseous demineralization. Diffuse disc space narrowing and endplate spur formation throughout cervical spine. Multilevel facet degenerative changes. Prevertebral soft tissues normal thickness. Vertebral body heights maintained without fracture or subluxation. Visualized skull base intact. Lung apices clear. Soft tissues unremarkable.  IMPRESSION: Multilevel degenerative disc and facet disease changes of the cervical spine. Osseous demineralization. No acute abnormalities.   Original Report Authenticated By: Lollie Marrow, M.D.    Ct Cervical Spine Wo Contrast  10/08/2011  *RADIOLOGY REPORT*  Clinical Data:  Seizures, confusion, fever, weakness  CT HEAD WITHOUT CONTRAST CT CERVICAL SPINE WITHOUT CONTRAST  Technique:  Multidetector CT imaging of the head and cervical spine was performed following the standard protocol without intravenous contrast.  Multiplanar CT image reconstructions of the cervical spine were also generated.  Comparison:  01/06/2011  CT HEAD  Findings: Generalized atrophy. Normal ventricular morphology. No midline shift or mass effect. Scattered small vessel chronic ischemic changes of deep cerebral white matter. No intracranial hemorrhage, mass lesion or evidence of acute infarction. No extra-axial fluid collections. Visualized paranasal sinuses and mastoid air cells clear. Calvaria intact.  IMPRESSION: Atrophy with small vessel chronic ischemic changes of deep cerebral white matter. No acute intracranial abnormalities. Tiny focus of high attenuation blood seen in the high right frontal region on previous exam is no longer identified.  CT CERVICAL SPINE  Findings: Diffuse osseous demineralization. Diffuse disc space narrowing and endplate spur formation throughout cervical spine. Multilevel facet degenerative changes. Prevertebral soft tissues normal thickness.  Vertebral body heights maintained  without fracture or subluxation. Visualized skull base intact. Lung apices clear. Soft tissues unremarkable.  IMPRESSION: Multilevel degenerative disc and facet disease changes of the cervical spine. Osseous demineralization. No acute abnormalities.   Original Report Authenticated By: Lollie Marrow, M.D.     Lab Results: Basic Metabolic Panel: No results found for this basename: NA:2,K:2,CL:2,CO2:2,GLUCOSE:2,BUN:2,CREATININE:2,CALCIUM:2,MG:2,PHOS:2 in the last 72 hours Liver Function Tests: No results found for this basename: AST:2,ALT:2,ALKPHOS:2,BILITOT:2,PROT:2,ALBUMIN:2 in the last 72 hours   CBC: No results found for this basename: WBC:2,NEUTROABS:2,HGB:2,HCT:2,MCV:2,PLT:2 in the last 72 hours  No results found for this or any previous visit (from the past 240 hour(s)).   Hospital Course: She was admitted with a urinary tract infection. She was somewhat confused. She was treated with IV antibiotics. She became afebrile. She pulled her IVs out. By the time of discharge she was afebrile and still confused but better.  Discharge Exam: Blood pressure 142/76, pulse 64, temperature 98 F (36.7 C), temperature source Oral, resp. rate 20, height 5\' 7"  (1.702 m), weight 71.532 kg (157 lb 11.2 oz), SpO2 95.00%. Confused. Awake and alert. Her chest is clear. Her heart was regular. Her abdomen is soft. Disposition: Home with home health services      Discharge Orders    Future Orders Please Complete By Expires   Home Health      Questions: Responses:   To provide the following care/treatments RN    PT   Face-to-face encounter      Comments:   I Kerington Hildebrant L certify that this patient is under my care and that I, or a nurse practitioner or physician's assistant working with me, had a face-to-face encounter that meets the physician face-to-face encounter requirements with this patient on 10/13/2011.   Questions: Responses:   The encounter with the patient was in whole, or in part, for the  following medical condition, which is the primary reason for home health care complicated UTI   I certify that, based on my findings, the following services are medically necessary home health services Nursing    Physical therapy   My clinical findings support the need for the above services High Risk for rehospitalization   Further, I certify that my clinical findings support that this patient is homebound due to: Mental confusion   To provide the following care/treatments RN    PT   Discharge patient         Follow-up Information    Follow up with [facility]. (PT, RN, [place] [phone])          Signed: Fredirick Maudlin Pager [phone]  10/21/2011, 9:30 AM

## 2011-11-14 ENCOUNTER — Encounter: Payer: Self-pay | Admitting: Internal Medicine

## 2011-11-21 ENCOUNTER — Telehealth: Payer: Self-pay | Admitting: Internal Medicine

## 2011-11-21 NOTE — Telephone Encounter (Signed)
Routed to RMR 

## 2011-11-21 NOTE — Telephone Encounter (Signed)
Okay I hope she feels better soon; let's  let's touch base with her in 3 months and see if she is ready at that time

## 2011-11-21 NOTE — Telephone Encounter (Signed)
Susan, please nic.  

## 2011-11-21 NOTE — Telephone Encounter (Signed)
Son called to let us know that patient is not up to having tcs at this time due to health issues.

## 2011-11-22 NOTE — Telephone Encounter (Signed)
Reminder in epic to follow up with patient in 3 months to see if she is feeling better to set up tcs

## 2012-01-23 ENCOUNTER — Encounter: Payer: Self-pay | Admitting: *Deleted

## 2012-06-14 ENCOUNTER — Encounter (HOSPITAL_COMMUNITY): Payer: Self-pay

## 2012-06-14 ENCOUNTER — Emergency Department (HOSPITAL_COMMUNITY)
Admission: EM | Admit: 2012-06-14 | Discharge: 2012-06-14 | Disposition: A | Discharge status: Home / Self Care | Payer: Medicare Other | Source: Home / Self Care | Attending: Emergency Medicine | Admitting: Emergency Medicine

## 2012-06-14 DIAGNOSIS — E876 Hypokalemia: Secondary | ICD-10-CM | POA: Insufficient documentation

## 2012-06-14 DIAGNOSIS — F0390 Unspecified dementia without behavioral disturbance: Secondary | ICD-10-CM | POA: Insufficient documentation

## 2012-06-14 DIAGNOSIS — Z880 Allergy status to penicillin: Secondary | ICD-10-CM | POA: Insufficient documentation

## 2012-06-14 DIAGNOSIS — G8911 Acute pain due to trauma: Secondary | ICD-10-CM | POA: Insufficient documentation

## 2012-06-14 DIAGNOSIS — Z79899 Other long term (current) drug therapy: Secondary | ICD-10-CM | POA: Insufficient documentation

## 2012-06-14 DIAGNOSIS — IMO0002 Reserved for concepts with insufficient information to code with codable children: Secondary | ICD-10-CM | POA: Insufficient documentation

## 2012-06-14 DIAGNOSIS — E079 Disorder of thyroid, unspecified: Secondary | ICD-10-CM | POA: Insufficient documentation

## 2012-06-14 HISTORY — DX: Unspecified dementia, unspecified severity, without behavioral disturbance, psychotic disturbance, mood disturbance, and anxiety: F03.90

## 2012-06-14 LAB — CBC WITH DIFFERENTIAL/PLATELET
Eosinophils Absolute: 0.2 10*3/uL (ref 0.0–0.7)
Eosinophils Relative: 3 % (ref 0–5)
Hemoglobin: 14.9 g/dL (ref 12.0–15.0)
Lymphs Abs: 1.2 10*3/uL (ref 0.7–4.0)
MCH: 30.8 pg (ref 26.0–34.0)
MCV: 88 fL (ref 78.0–100.0)
Monocytes Relative: 7 % (ref 3–12)
Neutrophils Relative %: 72 % (ref 43–77)
RBC: 4.84 MIL/uL (ref 3.87–5.11)

## 2012-06-14 LAB — URINALYSIS, ROUTINE W REFLEX MICROSCOPIC
Glucose, UA: NEGATIVE mg/dL
Specific Gravity, Urine: 1.015 (ref 1.005–1.030)

## 2012-06-14 LAB — BASIC METABOLIC PANEL
CO2: 30 mEq/L (ref 19–32)
Glucose, Bld: 102 mg/dL — ABNORMAL HIGH (ref 70–99)
Potassium: 2.8 mEq/L — ABNORMAL LOW (ref 3.5–5.1)
Sodium: 139 mEq/L (ref 135–145)

## 2012-06-14 MED ORDER — POTASSIUM CHLORIDE CRYS ER 20 MEQ PO TBCR
40.0000 meq | EXTENDED_RELEASE_TABLET | Freq: Once | ORAL | Status: AC
  Administered 2012-06-14: 40 meq via ORAL
  Filled 2012-06-14: qty 2

## 2012-06-14 MED ORDER — POTASSIUM CHLORIDE CRYS ER 20 MEQ PO TBCR: 20.0000 meq | EXTENDED_RELEASE_TABLET | Freq: Two times a day (BID) | ORAL | Status: DC

## 2012-06-14 NOTE — ED Notes (Signed)
[name] last week, agitated, and combative per daughter in law. Today she would not change her clothes.

## 2012-06-14 NOTE — ED Provider Notes (Signed)
History     This chart was scribed for Donnetta Hutching, MD, MD by Smitty Pluck, ED Scribe. The patient was seen in room APA12/APA12 and the patient's care was started at 10:27 AM.   CSN: [id number]  Arrival date & time 06/14/12  7846     Chief Complaint  Patient presents with  . Fall  . Agitation  . Leg Injury   Pt is level 5 caveat due to dementia.  The history is provided by a relative and medical records. No language interpreter was used.   HPI Comments: [name] is a 79 y.o. female with hx of dementia who presents to the Emergency Department BIB daughter in-law complaining of pt being combative today. The daughter in-law reports that today the pt seemed that she did not want to change her clothes. Daughter in-law reports that pt constantly calls out for son that is deceased and she becomes aggitated. Pt lives with daughter in-law and her only living son is incarcerated.  Pt fell 1 week ago and was seen in [facility] ED. Pt's daughter in-law denies fever, chills, nausea, vomiting, diarrhea, weakness, cough, SOB and any other pain.    PCP is Dr. Juanetta Gosling   Past Medical History  Diagnosis Date  . Thyroid disease   . Dementia     Past Surgical History  Procedure Laterality Date  . Hemorroidectomy      No family history on file.  History  Substance Use Topics  . Smoking status: Never Smoker   . Smokeless tobacco: Never Used  . Alcohol Use: No    OB History   Grav Para Term Preterm Abortions TAB SAB Ect Mult Living                  Review of Systems  Unable to perform ROS: Dementia    Allergies  Penicillins  Home Medications   Current Outpatient Rx  Name  Route  Sig  Dispense  Refill  . ibuprofen (ADVIL,MOTRIN) 200 MG tablet   Oral   Take 400 mg by mouth every 8 (eight) hours as needed. pain         . levothyroxine (SYNTHROID, LEVOTHROID) 88 MCG tablet   Oral   Take 88 mcg by mouth daily.           Marland Kitchen LORazepam (ATIVAN) 0.5 MG tablet   Oral   Take  0.5 mg by mouth every 6 (six) hours as needed for anxiety.           BP 120/51  Pulse 78  Temp(Src) 98.6 F (37 C) (Oral)  Resp 18  Ht 5\' 7"  (1.702 m)  Wt 135 lb (61.236 kg)  BMI 21.14 kg/m2  SpO2 96%  Physical Exam  Nursing note and vitals reviewed. Constitutional: She appears well-developed and well-nourished.  HENT:  Head: Normocephalic and atraumatic.  Eyes: Conjunctivae and EOM are normal. Pupils are equal, round, and reactive to light.  Neck: Normal range of motion. Neck supple.  Cardiovascular: Normal rate, regular rhythm and normal heart sounds.   Pulmonary/Chest: Effort normal and breath sounds normal.  Abdominal: Soft. Bowel sounds are normal.  Musculoskeletal: Normal range of motion.  Neurological: She is alert.  Oriented to person  Skin: Skin is warm and dry.  Psychiatric: She has a normal mood and affect.    ED Course  Procedures (including critical care time) DIAGNOSTIC STUDIES: Oxygen Saturation is 96% on room air, adequate by my interpretation.    COORDINATION OF CARE: 10:35  AM Discussed ED treatment with pt's daughter in-law (Child psychotherapist) and she agrees.   Results for orders placed during the hospital encounter of 06/14/12  URINALYSIS, ROUTINE W REFLEX MICROSCOPIC      Result Value Range   Color, Urine YELLOW  YELLOW   APPearance CLEAR  CLEAR   Specific Gravity, Urine 1.015  1.005 - 1.030   pH 6.5  5.0 - 8.0   Glucose, UA NEGATIVE  NEGATIVE mg/dL   Hgb urine dipstick SMALL (*) NEGATIVE   Bilirubin Urine SMALL (*) NEGATIVE   Ketones, ur TRACE (*) NEGATIVE mg/dL   Protein, ur NEGATIVE  NEGATIVE mg/dL   Urobilinogen, UA 1.0  0.0 - 1.0 mg/dL   Nitrite NEGATIVE  NEGATIVE   Leukocytes, UA NEGATIVE  NEGATIVE  BASIC METABOLIC PANEL      Result Value Range   Sodium 139  135 - 145 mEq/L   Potassium 2.8 (*) 3.5 - 5.1 mEq/L   Chloride 96  96 - 112 mEq/L   CO2 30  19 - 32 mEq/L   Glucose, Bld 102 (*) 70 - 99 mg/dL   BUN 13  6 - 23 mg/dL    Creatinine, Ser 0.27  0.50 - 1.10 mg/dL   Calcium 9.0  8.4 - 25.3 mg/dL   GFR calc non Af Amer 61 (*) >90 mL/min   GFR calc Af Amer 71 (*) >90 mL/min  CBC WITH DIFFERENTIAL      Result Value Range   WBC 6.5  4.0 - 10.5 K/uL   RBC 4.84  3.87 - 5.11 MIL/uL   Hemoglobin 14.9  12.0 - 15.0 g/dL   HCT 66.4  40.3 - 47.4 %   MCV 88.0  78.0 - 100.0 fL   MCH 30.8  26.0 - 34.0 pg   MCHC 35.0  30.0 - 36.0 g/dL   RDW 25.9  56.3 - 87.5 %   Platelets 183  150 - 400 K/uL   Neutrophils Relative 72  43 - 77 %   Neutro Abs 4.7  1.7 - 7.7 K/uL   Lymphocytes Relative 18  12 - 46 %   Lymphs Abs 1.2  0.7 - 4.0 K/uL   Monocytes Relative 7  3 - 12 %   Monocytes Absolute 0.5  0.1 - 1.0 K/uL   Eosinophils Relative 3  0 - 5 %   Eosinophils Absolute 0.2  0.0 - 0.7 K/uL   Basophils Relative 1  0 - 1 %   Basophils Absolute 0.0  0.0 - 0.1 K/uL  URINE MICROSCOPIC-ADD ON      Result Value Range   RBC / HPF 3-6  <3 RBC/hpf    Labs Reviewed - No data to display No results found.   No diagnosis found.    MDM  Patient has long-standing dementia.   Daughter-in-law is unable to care for her appropriately at home.   Will get social work consult for nursing home placement .   replace potassium      I personally performed the services described in this documentation, which was scribed in my presence. The recorded information has been reviewed and is accurate.    Donnetta Hutching, MD 06/14/12 [phone]

## 2012-06-14 NOTE — ED Notes (Signed)
Pts family to follow up with Dr. Juanetta Gosling on Monday to discuss placement into a nursing facility.

## 2012-06-14 NOTE — Clinical Social Work Note (Signed)
CSW met w patient and daughter in law in ED.  Daughter in law cares for patient by herself, patient was cared for by her son who passed away 04/13/12 from COPD and lung cancer.  Daughter in law describes patient as becoming agitated in evenings, has been behaviorally difficult.  Has wandered away from home, reportedly looking for her deceased son.  Daughter in law overwhelmed by care needs, wants placement for patient.  Provided daughter in law w information on SNF, ALF, Medicaid application and SNF placement process.  Encouraged daughter in law to work w PCP to complete placement process.  Also provided daughter in law w [employer] so she can access additional resources as needed.  Spoke w RN and MD regarding issue.  Santa Genera, LCSW Clinical Social Worker [phone])

## 2012-06-14 NOTE — ED Notes (Signed)
Dr. Adriana Simas made aware of K+ 2.8

## 2012-06-14 NOTE — Clinical Social Work Note (Signed)
Clinical Social Work Department BRIEF PSYCHOSOCIAL ASSESSMENT 06/14/2012  Patient:  [name], [name]     Account Number:  [id number]     Admit date:  06/14/2012  Clinical Social Worker:  Santa Genera, CLINICAL SOCIAL WORKER  Date/Time:  06/14/2012 03:00 PM  Referred by:  Physician  Date Referred:  06/14/2012 Referred for  Other - See comment   Other Referral:   SNF placement and other psychosocial issues   Interview type:  Family Other interview type:    PSYCHOSOCIAL DATA Living Status:  FAMILY Admitted from facility:   Level of care:   Primary support name:  Kerry Kass Primary support relationship to patient:  CHILD, ADULT Degree of support available:   Caregiver is daughter in law, overwhelmed by patient's car. Husband/son deceased Apr 08, 2012, other child in prison    CURRENT CONCERNS Current Concerns  Post-Acute Placement   Other Concerns:    SOCIAL WORK ASSESSMENT / PLAN CSW met w patient and daughter in ED.  Daughter in law is primary caregiver, having taken this responsibility after death of her husband/patient's son in 08-Apr-2012.  DIL is having difficult time caring for patient who is becoming increasingly combative and tends to wander off from home. DIL requests assistance w SNF placement, has not completed [place].  Gave DIL information on SNF and AFL placement process, validated DIL's caregiver fatigue/stress, encouraged her to work w PCP to facilitate placement as patient will not be admitted as inpatient.   Assessment/plan status:  Referral to [facility] Other assessment/ plan:   Information/referral to community resources:   SNF and ALF list  Medicaid placement information  SNF Handbook    PATIENT'S/FAMILY'S RESPONSE TO PLAN OF CARE: Daughter appreciative, but seems overwhelmed w responsibilities and caregiving.       Santa Genera, LCSW Clinical Social Worker [phone])

## 2012-06-14 NOTE — ED Notes (Signed)
Widowed daughter-in-law has been caring for patient, but can no longer handle her at home d/t increasing confusion and combativeness.  Patient has one son, who is in prison, who does not want patient placed in care facility.

## 2012-06-16 ENCOUNTER — Inpatient Hospital Stay (HOSPITAL_COMMUNITY)
Admission: EM | Admit: 2012-06-16 | Discharge: 2012-06-19 | DRG: 690 | Disposition: A | Discharge status: Skilled Nursing Facility | Payer: Medicare Other | Attending: Pulmonary Disease | Admitting: Pulmonary Disease

## 2012-06-16 ENCOUNTER — Emergency Department (HOSPITAL_COMMUNITY): Payer: Medicare Other

## 2012-06-16 ENCOUNTER — Encounter (HOSPITAL_COMMUNITY): Payer: Self-pay | Admitting: *Deleted

## 2012-06-16 DIAGNOSIS — E079 Disorder of thyroid, unspecified: Secondary | ICD-10-CM | POA: Diagnosis present

## 2012-06-16 DIAGNOSIS — Z880 Allergy status to penicillin: Secondary | ICD-10-CM

## 2012-06-16 DIAGNOSIS — G309 Alzheimer's disease, unspecified: Secondary | ICD-10-CM | POA: Diagnosis present

## 2012-06-16 DIAGNOSIS — F0390 Unspecified dementia without behavioral disturbance: Secondary | ICD-10-CM

## 2012-06-16 DIAGNOSIS — N390 Urinary tract infection, site not specified: Principal | ICD-10-CM

## 2012-06-16 DIAGNOSIS — B958 Unspecified staphylococcus as the cause of diseases classified elsewhere: Secondary | ICD-10-CM | POA: Diagnosis present

## 2012-06-16 DIAGNOSIS — F0280 Dementia in other diseases classified elsewhere without behavioral disturbance: Secondary | ICD-10-CM

## 2012-06-16 DIAGNOSIS — E039 Hypothyroidism, unspecified: Secondary | ICD-10-CM | POA: Diagnosis present

## 2012-06-16 DIAGNOSIS — E876 Hypokalemia: Secondary | ICD-10-CM | POA: Diagnosis present

## 2012-06-16 DIAGNOSIS — R4182 Altered mental status, unspecified: Secondary | ICD-10-CM

## 2012-06-16 HISTORY — DX: Hypokalemia: E87.6

## 2012-06-16 LAB — COMPREHENSIVE METABOLIC PANEL
ALT: 18 U/L (ref 0–35)
Alkaline Phosphatase: 69 U/L (ref 39–117)
CO2: 27 mEq/L (ref 19–32)
Calcium: 9.2 mg/dL (ref 8.4–10.5)
Chloride: 96 mEq/L (ref 96–112)
GFR calc Af Amer: 60 mL/min — ABNORMAL LOW (ref >90)
GFR calc non Af Amer: 52 mL/min — ABNORMAL LOW (ref >90)
Glucose, Bld: 114 mg/dL — ABNORMAL HIGH (ref 70–99)
Sodium: 136 mEq/L (ref 135–145)
Total Bilirubin: 1.7 mg/dL — ABNORMAL HIGH (ref 0.3–1.2)

## 2012-06-16 LAB — RAPID URINE DRUG SCREEN, HOSP PERFORMED
Benzodiazepines: NOT DETECTED
Cocaine: NOT DETECTED
Opiates: NOT DETECTED

## 2012-06-16 LAB — CBC WITH DIFFERENTIAL/PLATELET
Basophils Absolute: 0 10*3/uL (ref 0.0–0.1)
Basophils Relative: 0 % (ref 0–1)
Eosinophils Absolute: 0 10*3/uL (ref 0.0–0.7)
Hemoglobin: 15 g/dL (ref 12.0–15.0)
MCHC: 34.8 g/dL (ref 30.0–36.0)
Neutro Abs: 7.3 10*3/uL (ref 1.7–7.7)
Neutrophils Relative %: 82 % — ABNORMAL HIGH (ref 43–77)
Platelets: 206 10*3/uL (ref 150–400)
RDW: 13.7 % (ref 11.5–15.5)

## 2012-06-16 LAB — URINALYSIS, ROUTINE W REFLEX MICROSCOPIC
Bilirubin Urine: NEGATIVE
Glucose, UA: NEGATIVE mg/dL
Ketones, ur: 15 mg/dL — AB
Specific Gravity, Urine: 1.025 (ref 1.005–1.030)
pH: 6 (ref 5.0–8.0)

## 2012-06-16 LAB — URINE MICROSCOPIC-ADD ON

## 2012-06-16 MED ORDER — CIPROFLOXACIN IN D5W 400 MG/200ML IV SOLN
400.0000 mg | Freq: Two times a day (BID) | INTRAVENOUS | Status: DC
  Administered 2012-06-17 – 2012-06-18 (×3): 400 mg via INTRAVENOUS
  Filled 2012-06-16 (×5): qty 200

## 2012-06-16 MED ORDER — LEVOTHYROXINE SODIUM 88 MCG PO TABS
88.0000 ug | ORAL_TABLET | Freq: Every day | ORAL | Status: DC
  Administered 2012-06-17 – 2012-06-19 (×3): 88 ug via ORAL
  Filled 2012-06-16 (×6): qty 1

## 2012-06-16 MED ORDER — ONDANSETRON HCL 4 MG PO TABS
4.0000 mg | ORAL_TABLET | Freq: Four times a day (QID) | ORAL | Status: DC | PRN
  Administered 2012-06-19: 4 mg via ORAL
  Filled 2012-06-16: qty 1

## 2012-06-16 MED ORDER — ONDANSETRON HCL 4 MG/2ML IJ SOLN: 4.0000 mg | Freq: Four times a day (QID) | INTRAMUSCULAR | Status: DC | PRN

## 2012-06-16 MED ORDER — HEPARIN SODIUM (PORCINE) 5000 UNIT/ML IJ SOLN
5000.0000 [IU] | Freq: Three times a day (TID) | INTRAMUSCULAR | Status: DC
  Administered 2012-06-16 – 2012-06-19 (×10): 5000 [IU] via SUBCUTANEOUS
  Filled 2012-06-16 (×10): qty 1

## 2012-06-16 MED ORDER — LORAZEPAM 0.5 MG PO TABS
0.5000 mg | ORAL_TABLET | Freq: Four times a day (QID) | ORAL | Status: DC | PRN
  Administered 2012-06-16 – 2012-06-18 (×4): 0.5 mg via ORAL
  Filled 2012-06-16 (×4): qty 1

## 2012-06-16 MED ORDER — POTASSIUM CHLORIDE CRYS ER 20 MEQ PO TBCR: 40.0000 meq | EXTENDED_RELEASE_TABLET | Freq: Once | ORAL | Status: AC

## 2012-06-16 MED ORDER — CIPROFLOXACIN HCL 250 MG PO TABS
500.0000 mg | ORAL_TABLET | Freq: Once | ORAL | Status: AC
  Administered 2012-06-16: 500 mg via ORAL
  Filled 2012-06-16: qty 2

## 2012-06-16 MED ORDER — DEXTROSE 5 % IV SOLN: 1.0000 g | INTRAVENOUS | Status: DC

## 2012-06-16 MED ORDER — POTASSIUM CHLORIDE CRYS ER 20 MEQ PO TBCR
EXTENDED_RELEASE_TABLET | ORAL | Status: AC
  Administered 2012-06-16: 40 meq via ORAL
  Filled 2012-06-16: qty 2

## 2012-06-16 MED ORDER — POTASSIUM CHLORIDE CRYS ER 20 MEQ PO TBCR
20.0000 meq | EXTENDED_RELEASE_TABLET | Freq: Two times a day (BID) | ORAL | Status: DC
  Administered 2012-06-16 – 2012-06-19 (×6): 20 meq via ORAL
  Filled 2012-06-16 (×6): qty 1

## 2012-06-16 NOTE — ED Provider Notes (Signed)
History  This chart was scribed for Kristin Lennert, MD by Shari Heritage, ED Scribe. The patient was seen in room APA18/APA18. Patient's care was started at 1305.   CSN: [id number]  Arrival date & time 06/16/12  1301   First MD Initiated Contact with Patient 06/16/12 1305      Chief Complaint  Patient presents with  . Altered Mental Status     Patient is a 79 y.o. female presenting with altered mental status. The history is provided by a relative (pt has been getting more confused. only oriented to person). The history is limited by the condition of the patient. No language interpreter was used.  Altered Mental Status This is a chronic problem. The current episode started more than 1 week ago. The problem occurs constantly. The problem has not changed since onset.Pertinent negatives include no chest pain. Nothing aggravates the symptoms. Nothing relieves the symptoms. She has tried nothing for the symptoms. The treatment provided no relief.   Level 5 Caveat - Unable to obtain full history due to patient's AMS.  HPI Comments: [name] is a 79 y.o. female who presents to the Emergency Department complaining of altered mental status. Patient is currently being cared for at home by her daughter-in-law. Family states patient has had increased confusion and combative behavior for awhile. Patient's son passed away some time ago, and her daughter-in-law is the primary caregiver. Patient was seen here on 06/10/2012. The attending physician spoke to Dr. Lonzo Candy who has planned to make alternative living arrangements for patient, however, daughter-in-law is requesting IVC.   Additional history provided by daughter-in-law - Daughter-in-law states that patient's son passed away in 2014-02-20and daughter-in-law has been staying with patient since then. She states that patient has had increased confusion. Daughter-in-law has noticed that patient has been wiping stool on the wall, putting on clothes  incorrectly and behaving abnormally. Daughter-in-law wants arrangements for patient's care to be made as soon as possible.   Past Medical History  Diagnosis Date  . Thyroid disease   . Dementia   . Hypokalemia     Past Surgical History  Procedure Laterality Date  . Hemorroidectomy      No family history on file.  History  Substance Use Topics  . Smoking status: Never Smoker   . Smokeless tobacco: Never Used  . Alcohol Use: No    OB History   Grav Para Term Preterm Abortions TAB SAB Ect Mult Living                  Review of Systems  Unable to perform ROS: Dementia  Cardiovascular: Negative for chest pain.  Psychiatric/Behavioral: Positive for altered mental status.   Level 5 Caveat - Unable to obtain complete ROS due to patient's AMS.   Allergies  Penicillins  Home Medications   Current Outpatient Rx  Name  Route  Sig  Dispense  Refill  . ibuprofen (ADVIL,MOTRIN) 200 MG tablet   Oral   Take 400 mg by mouth every 8 (eight) hours as needed. pain         . levothyroxine (SYNTHROID, LEVOTHROID) 88 MCG tablet   Oral   Take 88 mcg by mouth daily.           Marland Kitchen LORazepam (ATIVAN) 0.5 MG tablet   Oral   Take 0.5 mg by mouth every 6 (six) hours as needed for anxiety.         . potassium chloride SA (K-DUR,KLOR-CON) 20 MEQ tablet  Oral   Take 1 tablet (20 mEq total) by mouth 2 (two) times daily.   20 tablet   0     Triage Vitals: BP 150/65  Pulse 84  Temp(Src) 98.9 F (37.2 C) (Oral)  Resp 16  Wt 135 lb (61.236 kg)  BMI 21.14 kg/m2  SpO2 96%  Physical Exam  Constitutional: She appears well-developed.  HENT:  Head: Normocephalic.  Eyes: Conjunctivae and EOM are normal. No scleral icterus.  Neck: Neck supple. No thyromegaly present.  Cardiovascular: Normal rate and regular rhythm.  Exam reveals no gallop and no friction rub.   No murmur heard. Pulmonary/Chest: No stridor. She has no wheezes. She has no rales. She exhibits no tenderness.   Abdominal: She exhibits no distension. There is no tenderness. There is no rebound.  Musculoskeletal: Normal range of motion. She exhibits no edema.  Lymphadenopathy:    She has no cervical adenopathy.  Neurological: Coordination normal.  Alert and oriented to person, but not place or time.  Skin: No rash noted. No erythema.  Psychiatric: She has a normal mood and affect. Her behavior is normal.    ED Course  Procedures (including critical care time) DIAGNOSTIC STUDIES: Oxygen Saturation is 96% on room air, adequate by my interpretation.    COORDINATION OF CARE: 1:24 PM- Will order CT head, UA, urine microscopic, CMP, CBC with diff and urine drug screen.  3:22 PM- Daughter-in-law is bedside. UA indicates UTI. Will speak with PCP regarding patient's ongoing care.   Labs Reviewed  COMPREHENSIVE METABOLIC PANEL - Abnormal; Notable for the following:    Potassium 3.1 (*)    Glucose, Bld 114 (*)    Total Bilirubin 1.7 (*)    GFR calc non Af Amer 52 (*)    GFR calc Af Amer 60 (*)    All other components within normal limits  CBC WITH DIFFERENTIAL - Abnormal; Notable for the following:    Neutrophils Relative 82 (*)    Lymphocytes Relative 11 (*)    All other components within normal limits  URINALYSIS, ROUTINE W REFLEX MICROSCOPIC - Abnormal; Notable for the following:    APPearance HAZY (*)    Hgb urine dipstick SMALL (*)    Ketones, ur 15 (*)    Nitrite POSITIVE (*)    All other components within normal limits  URINE MICROSCOPIC-ADD ON - Abnormal; Notable for the following:    Squamous Epithelial / LPF FEW (*)    Bacteria, UA MANY (*)    All other components within normal limits  URINE CULTURE  URINE RAPID DRUG SCREEN (HOSP PERFORMED)    Ct Head Wo Contrast  06/16/2012  *RADIOLOGY REPORT*  Clinical Data: Increased confusion.  The patient is combative.  CT HEAD WITHOUT CONTRAST  Technique:  Contiguous axial images were obtained from the base of the skull through the  vertex without contrast.  Comparison: CT head without contrast 10/08/2011 and 01/06/2011.  Findings: Mild prominence of the ventricles is again noted. Measurement across frontal horns is slightly more prominent than on the prior study, now measuring 43.5 mm.  There is prominence of the temporal tips as well.  The fourth ventricle is of normal size. Mild atrophy and scattered white matter disease is similar to the prior exams.  No acute cortical infarct or hemorrhage is present. There is no significant extra-axial fluid collection.  There is some fluid in the left sphenoid sinus.  A posterior left ethmoid air cell is opacified.  The mastoid air cells are clear.  The osseous skull is intact.  IMPRESSION:  1.  Slight increase in prominence of the ventricles.  Hydrocephalus should be considered. 2.  Stable atrophy white matter disease. 3.  Left sphenoid sinus disease.   Original Report Authenticated By: Marin Roberts, M.D.      No diagnosis found.    MDM      The chart was scribed for me under my direct supervision.  I personally performed the history, physical, and medical decision making and all procedures in the evaluation of this patient.Kristin Lennert, MD 06/16/12 1535

## 2012-06-16 NOTE — ED Notes (Signed)
Per EMS  - pt is currently being cared for by daughter-in-law at home.  Family states pt has had increased confusion and abnormal/combative behavior for "a while."  EMS was told by daughter in law that pt is having hallucinations, seeing people on the couch that aren't there.  Pt has been wiping her stool on the wall, putting clothes on the wrong way and has been combative toward daughter in law, worse in evenings.  Pt calm and pleasant at this time.

## 2012-06-16 NOTE — Progress Notes (Signed)
Skin Assessment:  Pt has red pea size areas on BLE toes that are on the top and are blanchable.  She also has a 8.3 cm long scratch on her left leg.  It is scabbed area and according to her caregiver from a fall at home.  On her abdomen RLQ, right lower abdomen she has a rash on the RLQ she has areas where a tick was removed from the ED.  She has approx three other areas that are also pink in the general area. She has no skin issues posteriorly, but she has generalized  bruising.

## 2012-06-16 NOTE — H&P (Signed)
[facility] History and Physical  [name] EAV:[id number] DOB: 1930-10-03 DOA: 06/16/2012  Referring physician: Dr. Estell Harpin, ER physician. PCP: Fredirick Maudlin, MD    Chief Complaint: Altered mental status.  HPI: [name] is a 79 y.o. female who has a history of mild to moderate dementia and has in the last few weeks become more combative with her main caregiver, which is her daughter-in-law. More recently, she has been in the emergency room with increased confusion, no fever. She comes once again to the emergency room with altered mental status, urinalysis is consistent with UTI. She is unable to keep any clear history due to her dementia. She does not know where she is, what day it is nor does she know her daughter-in-law.   Review of Systems:   Apart from history of present illness other systems negative.  Past Medical History  Diagnosis Date  . Thyroid disease   . Dementia   . Hypokalemia    Past Surgical History  Procedure Laterality Date  . Hemorroidectomy     Social History:  She lives with her daughter-in-law. She does not smoke cigarettes. She does not drink alcohol. She is able to walk by herself and feed herself.   Allergies  Allergen Reactions  . Penicillins     No family history on file. noncontributory.   Prior to Admission medications   Medication Sig Start Date End Date Taking? Authorizing Provider  ibuprofen (ADVIL,MOTRIN) 200 MG tablet Take 400 mg by mouth every 8 (eight) hours as needed. pain   Yes Historical Provider, MD  levothyroxine (SYNTHROID, LEVOTHROID) 88 MCG tablet Take 88 mcg by mouth daily.     Yes Historical Provider, MD  LORazepam (ATIVAN) 0.5 MG tablet Take 0.5 mg by mouth every 6 (six) hours as needed for anxiety.   Yes Historical Provider, MD  potassium chloride SA (K-DUR,KLOR-CON) 20 MEQ tablet Take 1 tablet (20 mEq total) by mouth 2 (two) times daily. 06/14/12  Yes Donnetta Hutching, MD   Physical Exam: Filed Vitals:   06/16/12  1333 06/16/12 1507 06/16/12 1543  BP: 150/65 136/54   Pulse: 84 78   Temp: 98.9 F (37.2 C)  98.1 F (36.7 C)  TempSrc: Oral    Resp: 16 17   Weight: 61.236 kg (135 lb)    SpO2: 96% 97%      General:  She looks systemically well. She does not look toxic or septic.  Eyes: No pallor. No jaundice.  ENT: No abnormalities.  Neck: No lymphadenopathy.  Cardiovascular: Heart sounds are present and normal without murmurs or added sounds. Jugular venous pressure is not raised. There are no carotid bruits.  Respiratory: Lung fields are clear.  Abdomen: Soft, nontender. No masses. No hepatosplenomegaly.  Skin: No rash.  Musculoskeletal: No acute joint abnormalities.  Psychiatric: Not examined.  Neurologic: Alert. No focal neurological signs. Not orientated to place time or person. This is her baseline dementia.  Labs on Admission:  Basic Metabolic Panel:  Recent Labs Lab 06/14/12 1101 06/16/12 1334  NA 139 136  K 2.8* 3.1*  CL 96 96  CO2 30 27  GLUCOSE 102* 114*  BUN 13 20  CREATININE 0.88 1.01  CALCIUM 9.0 9.2   Liver Function Tests:  Recent Labs Lab 06/16/12 1334  AST 24  ALT 18  ALKPHOS 69  BILITOT 1.7*  PROT 7.0  ALBUMIN 4.0     CBC:  Recent Labs Lab 06/14/12 1101 06/16/12 1334  WBC 6.5 9.0  NEUTROABS 4.7  7.3  HGB 14.9 15.0  HCT 42.6 43.1  MCV 88.0 87.4  PLT 183 206      Radiological Exams on Admission: Ct Head Wo Contrast  06/16/2012  *RADIOLOGY REPORT*  Clinical Data: Increased confusion.  The patient is combative.  CT HEAD WITHOUT CONTRAST  Technique:  Contiguous axial images were obtained from the base of the skull through the vertex without contrast.  Comparison: CT head without contrast 10/08/2011 and 01/06/2011.  Findings: Mild prominence of the ventricles is again noted. Measurement across frontal horns is slightly more prominent than on the prior study, now measuring 43.5 mm.  There is prominence of the temporal tips as well.  The  fourth ventricle is of normal size. Mild atrophy and scattered white matter disease is similar to the prior exams.  No acute cortical infarct or hemorrhage is present. There is no significant extra-axial fluid collection.  There is some fluid in the left sphenoid sinus.  A posterior left ethmoid air cell is opacified.  The mastoid air cells are clear.  The osseous skull is intact.  IMPRESSION:  1.  Slight increase in prominence of the ventricles.  Hydrocephalus should be considered. 2.  Stable atrophy white matter disease. 3.  Left sphenoid sinus disease.   Original Report Authenticated By: Marin Roberts, M.D.       Assessment/Plan   1. Urinary tract infection. 2. Mild to moderate dementia. 3. Hypothyroidism. 4. Combative/aggressive behavior secondary to #1 and #2.  Plan: 1. Admit to medical floor. 2. Intravenous antibiotics for UTI. 3. Social work consult regarding placement. 4. Physical therapy evaluation . Further recommendations will depend on patient's hospital progress.   Code Status: Full code.  Family Communication: Discussed plan with patient's daughter-in-law, at the bedside.   Disposition Plan: Depending on progress.   Time spent: 45 minutes.  Wilson Singer [facility] Pager [phone].  If 7PM-7AM, please contact night-coverage [email or web address] Password [facility] 06/16/2012, 4:44 PM

## 2012-06-16 NOTE — ED Notes (Signed)
Pt has multiple bruising to upper extremities in various stages of healing.  Pt has two ticks attached to right upper and lower abd.  edp notified and ordered to attempt to remove.  Pt covered in feces upon arrival.  Pt cleaned and removed soiled clothing.  nad noted at this time.

## 2012-06-17 LAB — CBC
HCT: 39.5 % (ref 36.0–46.0)
Hemoglobin: 13.7 g/dL (ref 12.0–15.0)
MCH: 30.6 pg (ref 26.0–34.0)
MCV: 88.2 fL (ref 78.0–100.0)
Platelets: 198 10*3/uL (ref 150–400)
RBC: 4.48 MIL/uL (ref 3.87–5.11)

## 2012-06-17 LAB — COMPREHENSIVE METABOLIC PANEL
AST: 22 U/L (ref 0–37)
Albumin: 3.1 g/dL — ABNORMAL LOW (ref 3.5–5.2)
Chloride: 99 mEq/L (ref 96–112)
Creatinine, Ser: 0.9 mg/dL (ref 0.50–1.10)
Potassium: 3.2 mEq/L — ABNORMAL LOW (ref 3.5–5.1)
Total Bilirubin: 1.1 mg/dL (ref 0.3–1.2)
Total Protein: 5.8 g/dL — ABNORMAL LOW (ref 6.0–8.3)

## 2012-06-17 MED ORDER — SODIUM CHLORIDE 0.9 % IV SOLN: 250.0000 mL | INTRAVENOUS | Status: DC | PRN

## 2012-06-17 MED ORDER — ENSURE COMPLETE PO LIQD
237.0000 mL | Freq: Two times a day (BID) | ORAL | Status: DC
  Administered 2012-06-17 – 2012-06-19 (×4): 237 mL via ORAL

## 2012-06-17 MED ORDER — SODIUM CHLORIDE 0.9 % IJ SOLN: 3.0000 mL | INTRAMUSCULAR | Status: DC | PRN

## 2012-06-17 MED ORDER — POTASSIUM CHLORIDE CRYS ER 20 MEQ PO TBCR
40.0000 meq | EXTENDED_RELEASE_TABLET | Freq: Once | ORAL | Status: AC
  Administered 2012-06-17: 40 meq via ORAL
  Filled 2012-06-17: qty 2

## 2012-06-17 MED ORDER — SODIUM CHLORIDE 0.9 % IJ SOLN
3.0000 mL | Freq: Two times a day (BID) | INTRAMUSCULAR | Status: DC
  Administered 2012-06-17 – 2012-06-19 (×3): 3 mL via INTRAVENOUS

## 2012-06-17 NOTE — Progress Notes (Signed)
INITIAL NUTRITION ASSESSMENT  DOCUMENTATION CODES Per approved criteria  -Not Applicable   INTERVENTION: Ensure Complete po BID, each supplement provides 350 kcal and 13 grams of protein.  NUTRITION DIAGNOSIS: Predicted suboptimal oral intake related to altered mental status as evidenced by UTI, dementia, 26# wt loss.   Goal: Pt to meet >/= 90% of their estimated nutrition needs  Monitor:  Po intake, labs and wt trends  Reason for Assessment: Malnutrition Screen  79 y.o. female  Admitting Dx: Altered mental status  ASSESSMENT: Pleasantly confused lady who is unable to provide meaningful hx. She has experienced 26#, 16% wt loss since discharged in mid-September last year. Currently her meal intake is excellent 100% of breakfast and lunch.   Will continue to monitor meals and add oral supplement due to her excess wt loss; which may be associated with her dementia disease progression.   Plans for SNF as discharge in process.   Height: Ht Readings from Last 1 Encounters:  06/16/12 5\' 7"  (1.702 m)    Weight: Wt Readings from Last 1 Encounters:  06/16/12 130 lb 11.7 oz (59.3 kg)    Ideal Body Weight: 135# (61.3 kg)  % Ideal Body Weight: 97%  Wt Readings from Last 10 Encounters:  06/16/12 130 lb 11.7 oz (59.3 kg)  06/14/12 135 lb (61.236 kg)  10/09/11 157 lb 11.2 oz (71.532 kg)    Usual Body Weight: 157.11# (discharge wt on 10/21/11)  % Usual Body Weight: 83%  BMI:  Body mass index is 20.47 kg/(m^2). Normal range  Estimated Nutritional Needs: Kcal: 1475-1770 Protein: 60-70 gr Fluid: >1800 ml/day  Skin: No issues noted  Diet Order: General  EDUCATION NEEDS: -Education not appropriate at this time   Intake/Output Summary (Last 24 hours) at 06/17/12 1423 Last data filed at 06/17/12 1311  Gross per 24 hour  Intake    480 ml  Output      0 ml  Net    480 ml    Last BM: 06/16/12  Labs:   Recent Labs Lab 06/14/12 1101 06/16/12 1334 06/17/12 0500   NA 139 136 138  K 2.8* 3.1* 3.2*  CL 96 96 99  CO2 30 27 29   BUN 13 20 16   CREATININE 0.88 1.01 0.90  CALCIUM 9.0 9.2 8.4  GLUCOSE 102* 114* 95    CBG (last 3)  No results found for this basename: GLUCAP,  in the last 72 hours  Scheduled Meds: . ciprofloxacin  400 mg Intravenous Q12H  . heparin  5,000 Units Subcutaneous Q8H  . levothyroxine  88 mcg Oral QAC breakfast  . potassium chloride SA  20 mEq Oral BID  . sodium chloride  3 mL Intravenous Q12H    Continuous Infusions:   Past Medical History  Diagnosis Date  . Thyroid disease   . Dementia   . Hypokalemia     Past Surgical History  Procedure Laterality Date  . Hemorroidectomy      Royann Shivers MS,RD,LDN,CSG Office: [phone] Pager: [phone]

## 2012-06-17 NOTE — Progress Notes (Signed)
Subjective: She is awake and alert but confused  Objective: Vital signs in last 24 hours: Temp:  [97.7 F (36.5 C)-98.9 F (37.2 C)] 97.7 F (36.5 C) (05/12 0605) Pulse Rate:  [72-88] 88 (05/12 0828) Resp:  [16-18] 18 (05/12 0605) BP: (106-150)/(54-71) 116/67 mmHg (05/12 0605) SpO2:  [94 %-100 %] 94 % (05/12 0828) Weight:  [59.3 kg (130 lb 11.7 oz)-61.236 kg (135 lb)] 59.3 kg (130 lb 11.7 oz) (05/11 1756) Weight change:  Last BM Date: 06/16/12  Intake/Output from previous day:    PHYSICAL EXAM General appearance: alert and Confused Resp: clear to auscultation bilaterally Cardio: regular rate and rhythm, S1, S2 normal, no murmur, click, rub or gallop GI: soft, non-tender; bowel sounds normal; no masses,  no organomegaly Extremities: extremities normal, atraumatic, no cyanosis or edema  Lab Results:    Basic Metabolic Panel:  Recent Labs  14/78/29 1334 06/17/12 0500  NA 136 138  K 3.1* 3.2*  CL 96 99  CO2 27 29  GLUCOSE 114* 95  BUN 20 16  CREATININE 1.01 0.90  CALCIUM 9.2 8.4   Liver Function Tests:  Recent Labs  06/16/12 1334 06/17/12 0500  AST 24 22  ALT 18 14  ALKPHOS 69 58  BILITOT 1.7* 1.1  PROT 7.0 5.8*  ALBUMIN 4.0 3.1*   No results found for this basename: LIPASE, AMYLASE,  in the last 72 hours No results found for this basename: AMMONIA,  in the last 72 hours CBC:  Recent Labs  06/14/12 1101 06/16/12 1334 06/17/12 0500  WBC 6.5 9.0 6.3  NEUTROABS 4.7 7.3  --   HGB 14.9 15.0 13.7  HCT 42.6 43.1 39.5  MCV 88.0 87.4 88.2  PLT 183 206 198   Cardiac Enzymes: No results found for this basename: CKTOTAL, CKMB, CKMBINDEX, TROPONINI,  in the last 72 hours BNP: No results found for this basename: PROBNP,  in the last 72 hours D-Dimer: No results found for this basename: DDIMER,  in the last 72 hours CBG: No results found for this basename: GLUCAP,  in the last 72 hours Hemoglobin A1C: No results found for this basename: HGBA1C,  in the  last 72 hours Fasting Lipid Panel: No results found for this basename: CHOL, HDL, LDLCALC, TRIG, CHOLHDL, LDLDIRECT,  in the last 72 hours Thyroid Function Tests: No results found for this basename: TSH, T4TOTAL, FREET4, T3FREE, THYROIDAB,  in the last 72 hours Anemia Panel: No results found for this basename: VITAMINB12, FOLATE, FERRITIN, TIBC, IRON, RETICCTPCT,  in the last 72 hours Coagulation: No results found for this basename: LABPROT, INR,  in the last 72 hours Urine Drug Screen: Drugs of Abuse     Component Value Date/Time   LABOPIA NONE DETECTED 06/16/2012 1415   COCAINSCRNUR NONE DETECTED 06/16/2012 1415   LABBENZ NONE DETECTED 06/16/2012 1415   AMPHETMU NONE DETECTED 06/16/2012 1415   THCU NONE DETECTED 06/16/2012 1415   LABBARB NONE DETECTED 06/16/2012 1415    Alcohol Level: No results found for this basename: ETH,  in the last 72 hours Urinalysis:  Recent Labs  06/14/12 1145 06/16/12 1415  COLORURINE YELLOW YELLOW  LABSPEC 1.015 1.025  PHURINE 6.5 6.0  GLUCOSEU NEGATIVE NEGATIVE  HGBUR SMALL* SMALL*  BILIRUBINUR SMALL* NEGATIVE  KETONESUR TRACE* 15*  PROTEINUR NEGATIVE NEGATIVE  UROBILINOGEN 1.0 1.0  NITRITE NEGATIVE POSITIVE*  LEUKOCYTESUR NEGATIVE NEGATIVE   Misc. Labs:  ABGS No results found for this basename: PHART, PCO2, PO2ART, TCO2, HCO3,  in the last 72 hours CULTURES  No results found for this or any previous visit (from the past 240 hour(s)). Studies/Results: Ct Head Wo Contrast  06/16/2012  *RADIOLOGY REPORT*  Clinical Data: Increased confusion.  The patient is combative.  CT HEAD WITHOUT CONTRAST  Technique:  Contiguous axial images were obtained from the base of the skull through the vertex without contrast.  Comparison: CT head without contrast 10/08/2011 and 01/06/2011.  Findings: Mild prominence of the ventricles is again noted. Measurement across frontal horns is slightly more prominent than on the prior study, now measuring 43.5 mm.  There is  prominence of the temporal tips as well.  The fourth ventricle is of normal size. Mild atrophy and scattered white matter disease is similar to the prior exams.  No acute cortical infarct or hemorrhage is present. There is no significant extra-axial fluid collection.  There is some fluid in the left sphenoid sinus.  A posterior left ethmoid air cell is opacified.  The mastoid air cells are clear.  The osseous skull is intact.  IMPRESSION:  1.  Slight increase in prominence of the ventricles.  Hydrocephalus should be considered. 2.  Stable atrophy white matter disease. 3.  Left sphenoid sinus disease.   Original Report Authenticated By: Marin Roberts, M.D.     Medications:  Prior to Admission:  Prescriptions prior to admission  Medication Sig Dispense Refill  . ibuprofen (ADVIL,MOTRIN) 200 MG tablet Take 400 mg by mouth every 8 (eight) hours as needed. pain      . levothyroxine (SYNTHROID, LEVOTHROID) 88 MCG tablet Take 88 mcg by mouth daily.        Marland Kitchen LORazepam (ATIVAN) 0.5 MG tablet Take 0.5 mg by mouth every 6 (six) hours as needed for anxiety.      . potassium chloride SA (K-DUR,KLOR-CON) 20 MEQ tablet Take 1 tablet (20 mEq total) by mouth 2 (two) times daily.  20 tablet  0   Scheduled: . ciprofloxacin  400 mg Intravenous Q12H  . heparin  5,000 Units Subcutaneous Q8H  . levothyroxine  88 mcg Oral QAC breakfast  . potassium chloride SA  20 mEq Oral BID  . potassium chloride  40 mEq Oral Once   Continuous:  WUJ:[id number], ondansetron (ZOFRAN) IV, ondansetron  Assesment: she has altered mental status. She has urinary tract infection. She has dementia which seems to be worsening possibly related to the UTI but she has had progression of her dementia over the last several months anyway  Principal Problem:   Altered mental status Active Problems:   Complicated UTI (urinary tract infection)   Hypokalemia   Hypothyroidism   Dementia    Plan: Continue IV antibiotics. I don't think her  daughter-in-law is going to be able to take care of her at home    LOS: 1 day   Veena Sturgess L 06/17/2012, 8:49 AM

## 2012-06-17 NOTE — Clinical Social Work Psychosocial (Signed)
     Clinical Social Work Department BRIEF PSYCHOSOCIAL ASSESSMENT 06/14/2012  Patient:  [name], [name]     Account Number:  [id number]     Admit date:  06/14/2012  Clinical Social Worker:  Santa Genera, CLINICAL SOCIAL WORKER  Date/Time:  06/14/2012 03:00 PM  Referred by:  Physician  Date Referred:  06/14/2012 Referred for  Other - See comment   Other Referral:   SNF placement and other psychosocial issues   Interview type:  Family Other interview type:    PSYCHOSOCIAL DATA Living Status:  FAMILY Admitted from facility:   Level of care:   Primary support name:  Kerry Kass Primary support relationship to patient:  CHILD, ADULT Degree of support available:   Caregiver is daughter in law, overwhelmed by patient's car. Husband/son deceased 04-12-12, other child in prison    CURRENT CONCERNS Current Concerns  Post-Acute Placement   Other Concerns:    SOCIAL WORK ASSESSMENT / PLAN CSW met w patient and daughter in ED.  Daughter in law is primary caregiver, having taken this responsibility after death of her husband/patient's son in 04/12/2012.  DIL is having difficult time caring for patient who is becoming increasingly combative and tends to wander off from home. DIL requests assistance w SNF placement, has not completed [place] application.  Gave DIL information on SNF and AFL placement process, validated DIL's caregiver fatigue/stress, encouraged her to work w PCP to facilitate placement as patient will not be admitted as inpatient.   Assessment/plan status:  Referral to [facility] Other assessment/ plan:   Information/referral to community resources:   SNF and ALF list  Medicaid placement information  SNF Handbook    PATIENTS/FAMILYS RESPONSE TO PLAN OF CARE: Daughter appreciative, but seems overwhelmed w responsibilities and caregiving.

## 2012-06-17 NOTE — Evaluation (Signed)
Physical Therapy Evaluation Patient Details Name: [name] MRN: [id number] DOB: 10-13-1930 Today's Date: 06/17/2012 Time: 4098-1191 PT Time Calculation (min): 40 min  PT Assessment / Plan / Recommendation Clinical Impression  Pt was seen for evaluation.  She is alert and very pleasant/cooperative but unable to carry on a converation due to severe demential  She did not have any functional impairment.  If placement is needed, it would need to be in ACLF.    PT Assessment  Patent does not need any further PT services    Follow Up Recommendations  No PT follow up    Does the patient have the potential to tolerate intense rehabilitation      Barriers to Discharge        Equipment Recommendations       Recommendations for Other Services     Frequency      Precautions / Restrictions Precautions Precautions: Fall Restrictions Weight Bearing Restrictions: No   Pertinent Vitals/Pain       Mobility  Bed Mobility Bed Mobility: Sit to Supine;Supine to Sit Supine to Sit: 7: Independent;HOB flat Sit to Supine: 7: Independent;HOB flat Transfers Transfers: Sit to Stand;Stand to Sit Sit to Stand: 7: Independent;From bed Stand to Sit: 7: Independent;To bed Ambulation/Gait Ambulation/Gait Assistance: 6: Modified independent (Device/Increase time) Ambulation Distance (Feet): 300 Feet Assistive device: None;Rolling walker Ambulation/Gait Assistance Details: pt states that she usually uses a walker for gait, however she is able to walk without assistive device and good stability...I Gait Pattern: Within Functional Limits Gait velocity: WNL Stairs: No Wheelchair Mobility Wheelchair Mobility: No    Exercises     PT Diagnosis:    PT Problem List:   PT Treatment Interventions:     PT Goals    Visit Information  Last PT Received On: 06/17/12    Subjective Data  Subjective: no c/o Patient Stated Goal: none stated   Prior Functioning  Home Living Lives With:  Family Available Help at Discharge: Family;Available 24 hours/day Type of Home: House Home Access: Stairs to enter [employer] of Steps: 3 Entrance Stairs-Rails: Left Home Layout: One level Home Adaptive Equipment: Walker - rolling;Straight cane Additional Comments: above info is provided by pt.Marland KitchenMarland KitchenI am unsure whether this is correct Prior Function Level of Independence: Needs assistance Needs Assistance: Meal Prep;Light Housekeeping Meal Prep: Maximal Light Housekeeping: Maximal Able to Take Stairs?: Yes Vocation: Retired Musician: No difficulties    Copywriter, advertising Arousal/Alertness: Awake/alert Behavior During Therapy: WFL for tasks assessed/performed Overall Cognitive Status: Within Functional Limits for tasks assessed    Extremity/Trunk Assessment Right Lower Extremity Assessment RLE ROM/Strength/Tone: Within functional levels RLE Sensation: WFL - Light Touch RLE Coordination: WFL - gross motor Left Lower Extremity Assessment LLE ROM/Strength/Tone: Within functional levels LLE Sensation: WFL - Light Touch LLE Coordination: WFL - gross motor   Balance Balance Balance Assessed: Yes High Level Balance High Level Balance Activites: Side stepping;Direction changes;Backward walking;Turns;Sudden stops;Head turns High Level Balance Comments: No LOB with above  End of Session PT - End of Session Equipment Utilized During Treatment: Gait belt Activity Tolerance: Patient tolerated treatment well Patient left: in bed;with call bell/phone within reach;with bed alarm set  GP     Myrlene Broker L 06/17/2012, 2:10 PM

## 2012-06-17 NOTE — Clinical Social Work Psychosocial (Signed)
    Clinical Social Work Department BRIEF PSYCHOSOCIAL ASSESSMENT 06/17/2012  Patient:  [name], [name]     Account Number:  [id number]     Admit date:  06/16/2012  Clinical Social Worker:  Santa Genera, CLINICAL SOCIAL WORKER  Date/Time:  06/17/2012 10:30 AM  Referred by:  Physician  Date Referred:  06/17/2012 Referred for  SNF Placement   Other Referral:   Interview type:  Family Other interview type:    PSYCHOSOCIAL DATA Living Status:  FAMILY Admitted from facility:   Level of care:   Primary support name:  Kerry Kass Primary support relationship to patient:  CHILD, ADULT Degree of support available:   Inadequate to meet needs    CURRENT CONCERNS Current Concerns  Post-Acute Placement   Other Concerns:    SOCIAL WORK ASSESSMENT / PLAN CSW met w daughter in law, had previously met w patient and daughter in law in ED 06/14/12.  Daughter in law cares for patient alone at home, previously was cared for by son who died in 2014-02-22of COPD and lung cancer.  As patient was living alone and unable to care for self, daughter in law took patient into her home.  Patient has another son who is currently in prison and unable to assist, no other family members are identified as able/willing to help.    Daughter in law describes significant difficulty caring for patient - says patient becomes combative especially in evenings.  Has wandered off into woods, ostensibly searching for her deceased son.  Daughter in law says patient has smeared feces around bathroom and requires constant supervision.  Daughter in law is unable to provide the level of care patient requires and desires placement for patient.  Patient suffers from dementia and is unable to fully pariticpate in placement process.    CSW explained placement process, has asked RN CM for PT consult to determine level of care needed for patient. Patient does not currently have Medicaid coverage, daughter in law encouraged to apply on  patient's behalf in case long term placement is required.  Says she will complete application tomorrow at DSS.    Daughter was given information on SNFs, [place]icaid application process, SNF placement Friday in ED.  Daughter in law confirms she has this paperwork and does not need new copies.  CSW will monitor SNF placement process.   Assessment/plan status:  Psychosocial Support/Ongoing Assessment of Needs Other assessment/ plan:   Information/referral to community resources:   Daughter in Social worker has information needed    PATIENT'S/FAMILY'S RESPONSE TO PLAN OF CARE: Daughter in law adamant that she cannot care for patient at home safely, strongly desires placement at discharge.    Santa Genera, LCSW Clinical Social Worker [phone])

## 2012-06-17 NOTE — Care Management Note (Signed)
    Page 1 of 1   06/19/2012     1:17:55 PM   CARE MANAGEMENT NOTE 06/19/2012  Patient:  [name], [name]   Account Number:  [id number]  Date Initiated:  06/17/2012  Documentation initiated by:  Rosemary Holms  Subjective/Objective Assessment:   Pt admitted from home where she stays with her daughter in law. CM spoke to [facility] and she reports that pt has become increasingly confused and combative. Accusing her of hiding her deceased son. Caregiver states she can no longer take care     Action/Plan:   pt at home and needs her placed. CSW notified. Pt has another son who currently is in jail.   Anticipated DC Date:  06/18/2012   Anticipated DC Plan:  ASSISTED LIVING / REST HOME  In-house referral  Clinical Social Worker      DC Planning Services  CM consult      Choice offered to / List presented to:             Status of service:  Completed, signed off Medicare Important Message given?  YES (If response is "NO", the following Medicare IM given date fields will be blank) Date Medicare IM given:  06/19/2012 Date Additional Medicare IM given:    Discharge Disposition:  SKILLED NURSING FACILITY  Per UR Regulation:    If discussed at Long Length of Stay Meetings, dates discussed:    Comments:  06/19/12 Rosemary Holms RN BSN CM Daughter in [place] signed IM on behalf of pt. CSW assisting with placement and transfer guidance.  06/17/12 Rosemary Holms RN BSN CM

## 2012-06-18 DIAGNOSIS — G309 Alzheimer's disease, unspecified: Secondary | ICD-10-CM | POA: Diagnosis present

## 2012-06-18 LAB — URINE CULTURE: Colony Count: >=100000

## 2012-06-18 MED ORDER — TUBERCULIN PPD 5 UNIT/0.1ML ID SOLN
5.0000 [IU] | Freq: Once | INTRADERMAL | Status: DC
  Administered 2012-06-18: 5 [IU] via INTRADERMAL
  Filled 2012-06-18: qty 0.1

## 2012-06-18 MED ORDER — CIPROFLOXACIN HCL 250 MG PO TABS
500.0000 mg | ORAL_TABLET | Freq: Two times a day (BID) | ORAL | Status: DC
  Administered 2012-06-18 – 2012-06-19 (×2): 500 mg via ORAL
  Filled 2012-06-18 (×2): qty 2

## 2012-06-18 MED ORDER — SULFAMETHOXAZOLE-TMP DS 800-160 MG PO TABS
1.0000 | ORAL_TABLET | Freq: Two times a day (BID) | ORAL | Status: DC
  Administered 2012-06-18 (×2): 1 via ORAL
  Filled 2012-06-18 (×2): qty 1

## 2012-06-18 NOTE — Progress Notes (Signed)
Utilization Review Complete  

## 2012-06-18 NOTE — Clinical Social Work Placement (Signed)
Clinical Social Work Department CLINICAL SOCIAL WORK PLACEMENT NOTE 06/18/2012  Patient:  [name], [name]  Account Number:  [id number] Admit date:  06/16/2012  Clinical Social Worker:  Derenda Fennel, LCSW  Date/time:  06/18/2012 11:40 AM  Clinical Social Work is seeking post-discharge placement for this patient at the following level of care:   ASSISTED LIVING/REST HOME   (*CSW will update this form in Epic as items are completed)   06/18/2012  Patient/family provided with [facility] Department of Clinical Social Work's list of facilities offering this level of care within the geographic area requested by the patient (or if unable, by the patient's family).  06/18/2012  Patient/family informed of their freedom to choose among providers that offer the needed level of care, that participate in Medicare, Medicaid or managed care program needed by the patient, have an available bed and are willing to accept the patient.  06/18/2012  Patient/family informed of [facility]' ownership interest in [facility], as well as of the fact that they are under no obligation to receive care at this facility.  PASARR submitted to EDS on 06/18/2012 PASARR number received from EDS on 06/18/2012  FL2 transmitted to all facilities in geographic area requested by pt/family on  06/18/2012 FL2 transmitted to all facilities within larger geographic area on 06/18/2012  Patient informed that his/her managed care company has contracts with or will negotiate with  certain facilities, including the following:     Patient/family informed of bed offers received:   Patient chooses bed at  Physician recommends and patient chooses bed at    Patient to be transferred to  on   Patient to be transferred to facility by   The following physician request were entered in Epic:   Additional Comments:  Derenda Fennel, LCSW [phone]

## 2012-06-18 NOTE — Progress Notes (Addendum)
Subjective: She still confused. Her urine is growing Staphylococcus but I don't have the sensitivities yet. It is not apparently staph aureus  Objective: Vital signs in last 24 hours: Temp:  [97.5 F (36.4 C)-98.3 F (36.8 C)] 97.5 F (36.4 C) (05/13 0656) Pulse Rate:  [72] 72 (05/13 0656) Resp:  [18-20] 20 (05/13 0656) BP: (105-120)/(63-73) 120/63 mmHg (05/13 0656) SpO2:  [97 %-98 %] 98 % (05/13 0656) Weight change:  Last BM Date: 06/16/12  Intake/Output from previous day: 05/12 0701 - 05/13 0700 In: 843 [P.O.:840; I.V.:3] Out: 650 [Urine:650]  PHYSICAL EXAM General appearance: alert, mild distress and Confused Resp: clear to auscultation bilaterally Cardio: regular rate and rhythm, S1, S2 normal, no murmur, click, rub or gallop GI: soft, non-tender; bowel sounds normal; no masses,  no organomegaly Extremities: extremities normal, atraumatic, no cyanosis or edema  Lab Results:    Basic Metabolic Panel:  Recent Labs  40/98/11 1334 06/17/12 0500  NA 136 138  K 3.1* 3.2*  CL 96 99  CO2 27 29  GLUCOSE 114* 95  BUN 20 16  CREATININE 1.01 0.90  CALCIUM 9.2 8.4   Liver Function Tests:  Recent Labs  06/16/12 1334 06/17/12 0500  AST 24 22  ALT 18 14  ALKPHOS 69 58  BILITOT 1.7* 1.1  PROT 7.0 5.8*  ALBUMIN 4.0 3.1*   No results found for this basename: LIPASE, AMYLASE,  in the last 72 hours No results found for this basename: AMMONIA,  in the last 72 hours CBC:  Recent Labs  06/16/12 1334 06/17/12 0500  WBC 9.0 6.3  NEUTROABS 7.3  --   HGB 15.0 13.7  HCT 43.1 39.5  MCV 87.4 88.2  PLT 206 198   Cardiac Enzymes: No results found for this basename: CKTOTAL, CKMB, CKMBINDEX, TROPONINI,  in the last 72 hours BNP: No results found for this basename: PROBNP,  in the last 72 hours D-Dimer: No results found for this basename: DDIMER,  in the last 72 hours CBG: No results found for this basename: GLUCAP,  in the last 72 hours Hemoglobin A1C: No results  found for this basename: HGBA1C,  in the last 72 hours Fasting Lipid Panel: No results found for this basename: CHOL, HDL, LDLCALC, TRIG, CHOLHDL, LDLDIRECT,  in the last 72 hours Thyroid Function Tests:  Recent Labs  06/17/12 0500  TSH 0.019*   Anemia Panel: No results found for this basename: VITAMINB12, FOLATE, FERRITIN, TIBC, IRON, RETICCTPCT,  in the last 72 hours Coagulation: No results found for this basename: LABPROT, INR,  in the last 72 hours Urine Drug Screen: Drugs of Abuse     Component Value Date/Time   LABOPIA NONE DETECTED 06/16/2012 1415   COCAINSCRNUR NONE DETECTED 06/16/2012 1415   LABBENZ NONE DETECTED 06/16/2012 1415   AMPHETMU NONE DETECTED 06/16/2012 1415   THCU NONE DETECTED 06/16/2012 1415   LABBARB NONE DETECTED 06/16/2012 1415    Alcohol Level: No results found for this basename: ETH,  in the last 72 hours Urinalysis:  Recent Labs  06/16/12 1415  COLORURINE YELLOW  LABSPEC 1.025  PHURINE 6.0  GLUCOSEU NEGATIVE  HGBUR SMALL*  BILIRUBINUR NEGATIVE  KETONESUR 15*  PROTEINUR NEGATIVE  UROBILINOGEN 1.0  NITRITE POSITIVE*  LEUKOCYTESUR NEGATIVE   Misc. Labs:  ABGS No results found for this basename: PHART, PCO2, PO2ART, TCO2, HCO3,  in the last 72 hours CULTURES Recent Results (from the past 240 hour(s))  URINE CULTURE     Status: None   Collection Time  06/16/12  2:15 PM      Result Value Range Status   Specimen Description URINE, CATHETERIZED   Final   Special Requests NONE   Final   Culture  Setup Time 06/16/2012 18:53   Final   Colony Count >=100,000 COLONIES/ML   Final   Culture     Final   Value: STAPHYLOCOCCUS SPECIES (COAGULASE NEGATIVE)     Note: RIFAMPIN AND GENTAMICIN SHOULD NOT BE USED AS SINGLE DRUGS FOR TREATMENT OF STAPH INFECTIONS.   Report Status PENDING   Incomplete   Studies/Results: Ct Head Wo Contrast  06/16/2012  *RADIOLOGY REPORT*  Clinical Data: Increased confusion.  The patient is combative.  CT HEAD WITHOUT  CONTRAST  Technique:  Contiguous axial images were obtained from the base of the skull through the vertex without contrast.  Comparison: CT head without contrast 10/08/2011 and 01/06/2011.  Findings: Mild prominence of the ventricles is again noted. Measurement across frontal horns is slightly more prominent than on the prior study, now measuring 43.5 mm.  There is prominence of the temporal tips as well.  The fourth ventricle is of normal size. Mild atrophy and scattered white matter disease is similar to the prior exams.  No acute cortical infarct or hemorrhage is present. There is no significant extra-axial fluid collection.  There is some fluid in the left sphenoid sinus.  A posterior left ethmoid air cell is opacified.  The mastoid air cells are clear.  The osseous skull is intact.  IMPRESSION:  1.  Slight increase in prominence of the ventricles.  Hydrocephalus should be considered. 2.  Stable atrophy white matter disease. 3.  Left sphenoid sinus disease.   Original Report Authenticated By: Marin Roberts, M.D.     Medications:  Prior to Admission:  Prescriptions prior to admission  Medication Sig Dispense Refill  . ibuprofen (ADVIL,MOTRIN) 200 MG tablet Take 400 mg by mouth every 8 (eight) hours as needed. pain      . levothyroxine (SYNTHROID, LEVOTHROID) 88 MCG tablet Take 88 mcg by mouth daily.        Marland Kitchen LORazepam (ATIVAN) 0.5 MG tablet Take 0.5 mg by mouth every 6 (six) hours as needed for anxiety.      . potassium chloride SA (K-DUR,KLOR-CON) 20 MEQ tablet Take 1 tablet (20 mEq total) by mouth 2 (two) times daily.  20 tablet  0   Scheduled: . ciprofloxacin  400 mg Intravenous Q12H  . feeding supplement  237 mL Oral BID BM  . heparin  5,000 Units Subcutaneous Q8H  . levothyroxine  88 mcg Oral QAC breakfast  . potassium chloride SA  20 mEq Oral BID  . sodium chloride  3 mL Intravenous Q12H  . sulfamethoxazole-trimethoprim  1 tablet Oral Q12H   Continuous:  WUJ:WJXBJY chloride,  LORazepam, ondansetron (ZOFRAN) IV, ondansetron, sodium chloride  Assesment: She has severe dementia. She had a urinary tract infection. Sensitivities are not out on the urine yet Principal Problem:   Altered mental status Active Problems:   Complicated UTI (urinary tract infection)   Hypokalemia   Hypothyroidism   Dementia    Plan: I think she's going to need placement. I added Bactrim while we are awaiting the sensitivities. She lacks capacity to make decisions on her own. I think she will need to be in a locked dementia unit    LOS: 2 days   Naithen Rivenburg L 06/18/2012, 8:42 AM

## 2012-06-18 NOTE — Progress Notes (Signed)
PHARMACIST - PHYSICIAN COMMUNICATION DR:   Juanetta Gosling CONCERNING: Antibiotic IV to Oral Route Change Policy  RECOMMENDATION: This patient is receiving Cipro by the intravenous route.  Based on criteria approved by the Pharmacy and Therapeutics Committee, the antibiotic(s) is/are being converted to the equivalent oral dose form(s).   DESCRIPTION: These criteria include:  Patient being treated for a respiratory tract infection, urinary tract infection, or cellulitis  The patient is not neutropenic and does not exhibit a GI malabsorption state  The patient is eating (either orally or via tube) and/or has been taking other orally administered medications for a least 24 hours  The patient is improving clinically and has a Tmax < 100.5  If you have questions about this conversion, please contact the Pharmacy Department  [x]   [phone] )  [facility] []   [phone] )  [facility]  []   [phone] )  [facility] []   [phone] )  [facility]   **Urine cx growing coag negative staph.  Bactrim added today.  Anticipate d/c Cipro tomorrow when sensitivities available per MD note.    Junita Push, PharmD, BCPS 06/18/2012@2 :47 PM

## 2012-06-18 NOTE — Clinical Social Work Note (Signed)
CSW spoke with pt's daughter-in-law this morning to follow up on d/c plan. Notified her of PT evaluation recommending ALF. She is aware of payment and plans to apply for Medicaid today. Pt requires locked memory care unit per MD. CSW faxed out to ALF and will follow up with bed offers when available. Requested TB skin test of MD. ALF list left in pt's room.  [name], [place] [phone]

## 2012-06-18 NOTE — Clinical Social Work Note (Signed)
CSW received bed offer from [facility] on memory care unit. Discussed with pt's daughter-in-law who is agreeable. CSW spoke with Dorcas Mcmurray who took [facility] application today. Per Marylu Lund, pt may not qualify. Carley Hammed at [facility] notified and reports pt will have to be private pay for first month until they find out if pt qualifies for special assistance Medicaid. Left voicemail for Paulette to return call to discuss. CSW will follow up in AM.  Derenda Fennel, LCSW [phone]

## 2012-06-19 MED ORDER — DOXYCYCLINE HYCLATE 100 MG PO TABS
100.0000 mg | ORAL_TABLET | Freq: Two times a day (BID) | ORAL | Status: DC
  Administered 2012-06-19: 100 mg via ORAL
  Filled 2012-06-19: qty 1

## 2012-06-19 MED ORDER — DOXYCYCLINE HYCLATE 100 MG PO TABS: 100.0000 mg | ORAL_TABLET | Freq: Two times a day (BID) | ORAL | Status: DC

## 2012-06-19 NOTE — Discharge Summary (Signed)
Physician Discharge Summary  Patient ID: [name] MRN: [id number] DOB/AGE: 77-Apr-1932 77 y.o. Primary Care Physician:Markis Langland L, MD Admit date: 06/16/2012 Discharge date: 06/19/2012    Discharge Diagnoses:  Staph urinary tract infection Principal Problem:   Altered mental status Active Problems:   Complicated UTI (urinary tract infection)   Hypokalemia   Hypothyroidism   Dementia   Alzheimer's dementia     Medication List    TAKE these medications       doxycycline 100 MG tablet  Commonly known as:  VIBRA-TABS  Take 1 tablet (100 mg total) by mouth every 12 (twelve) hours.     ibuprofen 200 MG tablet  Commonly known as:  ADVIL,MOTRIN  Take 400 mg by mouth every 8 (eight) hours as needed. pain     levothyroxine 88 MCG tablet  Commonly known as:  SYNTHROID, LEVOTHROID  Take 88 mcg by mouth daily.     LORazepam 0.5 MG tablet  Commonly known as:  ATIVAN  Take 0.5 mg by mouth every 6 (six) hours as needed for anxiety.     potassium chloride SA 20 MEQ tablet  Commonly known as:  K-DUR,KLOR-CON  Take 1 tablet (20 mEq total) by mouth 2 (two) times daily.        Discharged Condition: Improved    Consults: None  Significant Diagnostic Studies: Ct Head Wo Contrast  06/16/2012   *RADIOLOGY REPORT*  Clinical Data: Increased confusion.  The patient is combative.  CT HEAD WITHOUT CONTRAST  Technique:  Contiguous axial images were obtained from the base of the skull through the vertex without contrast.  Comparison: CT head without contrast 10/08/2011 and 01/06/2011.  Findings: Mild prominence of the ventricles is again noted. Measurement across frontal horns is slightly more prominent than on the prior study, now measuring 43.5 mm.  There is prominence of the temporal tips as well.  The fourth ventricle is of normal size. Mild atrophy and scattered white matter disease is similar to the prior exams.  No acute cortical infarct or hemorrhage is present. There is no  significant extra-axial fluid collection.  There is some fluid in the left sphenoid sinus.  A posterior left ethmoid air cell is opacified.  The mastoid air cells are clear.  The osseous skull is intact.  IMPRESSION:  1.  Slight increase in prominence of the ventricles.  Hydrocephalus should be considered. 2.  Stable atrophy white matter disease. 3.  Left sphenoid sinus disease.   Original Report Authenticated By: Marin Roberts, M.D.    Lab Results: Basic Metabolic Panel:  Recent Labs  40/98/11 1334 06/17/12 0500  NA 136 138  K 3.1* 3.2*  CL 96 99  CO2 27 29  GLUCOSE 114* 95  BUN 20 16  CREATININE 1.01 0.90  CALCIUM 9.2 8.4   Liver Function Tests:  Recent Labs  06/16/12 1334 06/17/12 0500  AST 24 22  ALT 18 14  ALKPHOS 69 58  BILITOT 1.7* 1.1  PROT 7.0 5.8*  ALBUMIN 4.0 3.1*     CBC:  Recent Labs  06/16/12 1334 06/17/12 0500  WBC 9.0 6.3  NEUTROABS 7.3  --   HGB 15.0 13.7  HCT 43.1 39.5  MCV 87.4 88.2  PLT 206 198    Recent Results (from the past 240 hour(s))  URINE CULTURE     Status: None   Collection Time    06/16/12  2:15 PM      Result Value Range Status   Specimen Description URINE, CATHETERIZED  Final   Special Requests NONE   Final   Culture  Setup Time 06/16/2012 18:53   Final   Colony Count >=100,000 COLONIES/ML   Final   Culture     Final   Value: STAPHYLOCOCCUS SPECIES (COAGULASE NEGATIVE)     Note: RIFAMPIN AND GENTAMICIN SHOULD NOT BE USED AS SINGLE DRUGS FOR TREATMENT OF STAPH INFECTIONS.   Report Status 06/18/2012 FINAL   Final   Organism ID, Bacteria STAPHYLOCOCCUS SPECIES (COAGULASE NEGATIVE)   Final     Hospital Course: She was admitted with increased confusion combativeness et Karie Soda. She was found to have a urinary tract infection. She eventually grew a coagulase-negative Staphylococcus from her urine. She was started on IV fluids given IV antibiotics and then when sensitivities came back was switched to doxycycline. Her  combativeness resolved she was still very confused and her daughter-in-law who have been trying to provide her care felt that she was unable to continue that so she's going to have placement in a memory unit.  Discharge Exam: Blood pressure 106/70, pulse 69, temperature 97.7 F (36.5 C), temperature source Oral, resp. rate 17, height 5\' 7"  (1.702 m), weight 59.3 kg (130 lb 11.7 oz), SpO2 96.00%. She is awake and alert and confused. Her chest is clear.  Disposition: To a memory unit. She will take 10 days of doxycycline 100 mg twice a day      Discharge Orders   Future Orders Complete By Expires     Discharge patient  As directed          Signed: Kinjal Neitzke L Pager [phone]  06/19/2012, 8:53 AM

## 2012-06-19 NOTE — Clinical Social Work Placement (Signed)
Clinical Social Work Department CLINICAL SOCIAL WORK PLACEMENT NOTE 06/19/2012  Patient:  [name], [name]  Account Number:  [id number] Admit date:  06/16/2012  Clinical Social Worker:  Derenda Fennel, LCSW  Date/time:  06/18/2012 11:40 AM  Clinical Social Work is seeking post-discharge placement for this patient at the following level of care:   SKILLED NURSING   (*CSW will update this form in Epic as items are completed)   06/18/2012  Patient/family provided with [facility] Department of Clinical Social Work's list of facilities offering this level of care within the geographic area requested by the patient (or if unable, by the patient's family).  06/18/2012  Patient/family informed of their freedom to choose among providers that offer the needed level of care, that participate in Medicare, Medicaid or managed care program needed by the patient, have an available bed and are willing to accept the patient.  06/18/2012  Patient/family informed of [facility]' ownership interest in [facility], as well as of the fact that they are under no obligation to receive care at this facility.  PASARR submitted to EDS on 06/19/2012 PASARR number received from EDS on 06/19/2012  FL2 transmitted to all facilities in geographic area requested by pt/family on  06/19/2012 FL2 transmitted to all facilities within larger geographic area on   Patient informed that his/her managed care company has contracts with or will negotiate with  certain facilities, including the following:     Patient/family informed of bed offers received:  06/19/2012 Patient chooses bed at [facility] [place] Physician recommends and patient chooses bed at  [facility] [place]  Patient to be transferred to [facility]  on  06/19/2012 Patient to be transferred to facility by family  The following physician request were entered in Epic:   Additional Comments:   Derenda Fennel, LCSW [phone]

## 2012-06-19 NOTE — Progress Notes (Signed)
Subjective: She is more awake and alert. She has not had any problems with being combative.  Objective: Vital signs in last 24 hours: Temp:  [97.7 F (36.5 C)-98.2 F (36.8 C)] 97.7 F (36.5 C) (05/14 0500) Pulse Rate:  [69-74] 69 (05/14 0500) Resp:  [17-20] 17 (05/14 0500) BP: (106-130)/(65-70) 106/70 mmHg (05/14 0500) SpO2:  [96 %-98 %] 96 % (05/14 0500) Weight change:  Last BM Date: 06/16/12 (per chart; pt cannot recall last BM)  Intake/Output from previous day: 05/13 0701 - 05/14 0700 In: 1140 [P.O.:1140] Out: 1300 [Urine:1300]  PHYSICAL EXAM General appearance: alert and no distress Resp: clear to auscultation bilaterally Cardio: regular rate and rhythm, S1, S2 normal, no murmur, click, rub or gallop GI: soft, non-tender; bowel sounds normal; no masses,  no organomegaly Extremities: extremities normal, atraumatic, no cyanosis or edema  Lab Results:    Basic Metabolic Panel:  Recent Labs  02/07/70 1334 06/17/12 0500  NA 136 138  K 3.1* 3.2*  CL 96 99  CO2 27 29  GLUCOSE 114* 95  BUN 20 16  CREATININE 1.01 0.90  CALCIUM 9.2 8.4   Liver Function Tests:  Recent Labs  06/16/12 1334 06/17/12 0500  AST 24 22  ALT 18 14  ALKPHOS 69 58  BILITOT 1.7* 1.1  PROT 7.0 5.8*  ALBUMIN 4.0 3.1*   No results found for this basename: LIPASE, AMYLASE,  in the last 72 hours No results found for this basename: AMMONIA,  in the last 72 hours CBC:  Recent Labs  06/16/12 1334 06/17/12 0500  WBC 9.0 6.3  NEUTROABS 7.3  --   HGB 15.0 13.7  HCT 43.1 39.5  MCV 87.4 88.2  PLT 206 198   Cardiac Enzymes: No results found for this basename: CKTOTAL, CKMB, CKMBINDEX, TROPONINI,  in the last 72 hours BNP: No results found for this basename: PROBNP,  in the last 72 hours D-Dimer: No results found for this basename: DDIMER,  in the last 72 hours CBG: No results found for this basename: GLUCAP,  in the last 72 hours Hemoglobin A1C: No results found for this basename:  HGBA1C,  in the last 72 hours Fasting Lipid Panel: No results found for this basename: CHOL, HDL, LDLCALC, TRIG, CHOLHDL, LDLDIRECT,  in the last 72 hours Thyroid Function Tests:  Recent Labs  06/17/12 0500  TSH 0.019*   Anemia Panel: No results found for this basename: VITAMINB12, FOLATE, FERRITIN, TIBC, IRON, RETICCTPCT,  in the last 72 hours Coagulation: No results found for this basename: LABPROT, INR,  in the last 72 hours Urine Drug Screen: Drugs of Abuse     Component Value Date/Time   LABOPIA NONE DETECTED 06/16/2012 1415   COCAINSCRNUR NONE DETECTED 06/16/2012 1415   LABBENZ NONE DETECTED 06/16/2012 1415   AMPHETMU NONE DETECTED 06/16/2012 1415   THCU NONE DETECTED 06/16/2012 1415   LABBARB NONE DETECTED 06/16/2012 1415    Alcohol Level: No results found for this basename: ETH,  in the last 72 hours Urinalysis:  Recent Labs  06/16/12 1415  COLORURINE YELLOW  LABSPEC 1.025  PHURINE 6.0  GLUCOSEU NEGATIVE  HGBUR SMALL*  BILIRUBINUR NEGATIVE  KETONESUR 15*  PROTEINUR NEGATIVE  UROBILINOGEN 1.0  NITRITE POSITIVE*  LEUKOCYTESUR NEGATIVE   Misc. Labs:  ABGS No results found for this basename: PHART, PCO2, PO2ART, TCO2, HCO3,  in the last 72 hours CULTURES Recent Results (from the past 240 hour(s))  URINE CULTURE     Status: None   Collection Time  06/16/12  2:15 PM      Result Value Range Status   Specimen Description URINE, CATHETERIZED   Final   Special Requests NONE   Final   Culture  Setup Time 06/16/2012 18:53   Final   Colony Count >=100,000 COLONIES/ML   Final   Culture     Final   Value: STAPHYLOCOCCUS SPECIES (COAGULASE NEGATIVE)     Note: RIFAMPIN AND GENTAMICIN SHOULD NOT BE USED AS SINGLE DRUGS FOR TREATMENT OF STAPH INFECTIONS.   Report Status 06/18/2012 FINAL   Final   Organism ID, Bacteria STAPHYLOCOCCUS SPECIES (COAGULASE NEGATIVE)   Final   Studies/Results: No results found.  Medications:  Prior to Admission:  Prescriptions prior to  admission  Medication Sig Dispense Refill  . ibuprofen (ADVIL,MOTRIN) 200 MG tablet Take 400 mg by mouth every 8 (eight) hours as needed. pain      . levothyroxine (SYNTHROID, LEVOTHROID) 88 MCG tablet Take 88 mcg by mouth daily.        Marland Kitchen LORazepam (ATIVAN) 0.5 MG tablet Take 0.5 mg by mouth every 6 (six) hours as needed for anxiety.      . potassium chloride SA (K-DUR,KLOR-CON) 20 MEQ tablet Take 1 tablet (20 mEq total) by mouth 2 (two) times daily.  20 tablet  0   Scheduled: . ciprofloxacin  500 mg Oral BID  . feeding supplement  237 mL Oral BID BM  . heparin  5,000 Units Subcutaneous Q8H  . levothyroxine  88 mcg Oral QAC breakfast  . potassium chloride SA  20 mEq Oral BID  . sodium chloride  3 mL Intravenous Q12H  . sulfamethoxazole-trimethoprim  1 tablet Oral Q12H  . tuberculin  5 Units Intradermal Once   Continuous:  PXT:GGYIRS chloride, LORazepam, ondansetron (ZOFRAN) IV, ondansetron, sodium chloride  Assesment: She has a urinary tract infection which has caused her to have more problems with her Alzheimer's dementia. She is not competent to make decisions on her own and her daughter-in-law is trying to help. I think she's ready for discharge Principal Problem:   Altered mental status Active Problems:   Complicated UTI (urinary tract infection)   Hypokalemia   Hypothyroidism   Dementia   Alzheimer's dementia    Plan: Discharge     LOS: 3 days   Leavy Heatherly L 06/19/2012, 8:47 AM

## 2012-06-19 NOTE — Progress Notes (Addendum)
Pt is to be discharged to [facility] today. Pt is in NAD, IV is out, all paperwork has been reviewed/discussed with family, and there are no questions/concerns at this time. Assessment is unchanged from this morning. Pt is to be accompanied downstairs by staff and family via wheelchair. And transported by [employer] transportation system. Report has been called.

## 2012-06-19 NOTE — Clinical Social Work Note (Signed)
CSW spoke with pt's daughter-in-law regarding payment at ALF. She reports she is unable to pay anything for placement and Medicaid application is pending. CSW talked to [facility] about situation and they feel that pt will meet skilled for short term and will accept pt today. They are aware of Medicaid pending, family situation, and PT recommendations. Discussed with pt's daughter-in-law who accepts bed with same understanding that when skilled days run out, she will need to work on d/c plan. Pt to transfer with family. MD wrote script and [facility] will pick up. D/C summary faxed. CSW notified Maximiano Coss and Marylu Lund at [employer] of plan as well as [employer].  Derenda Fennel, [place] [phone]

## 2012-07-23 ENCOUNTER — Encounter: Payer: Self-pay | Admitting: Internal Medicine

## 2015-03-10 ENCOUNTER — Other Ambulatory Visit (HOSPITAL_COMMUNITY): Payer: Self-pay | Admitting: Internal Medicine

## 2015-03-10 DIAGNOSIS — R222 Localized swelling, mass and lump, trunk: Secondary | ICD-10-CM

## 2015-03-11 ENCOUNTER — Ambulatory Visit (HOSPITAL_COMMUNITY)
Admission: RE | Admit: 2015-03-11 | Discharge: 2015-03-11 | Disposition: A | Discharge status: Home / Self Care | Payer: Medicare Other | Source: Ambulatory Visit | Attending: Internal Medicine | Admitting: Internal Medicine

## 2015-03-11 DIAGNOSIS — K449 Diaphragmatic hernia without obstruction or gangrene: Secondary | ICD-10-CM | POA: Diagnosis not present

## 2015-03-11 DIAGNOSIS — R911 Solitary pulmonary nodule: Secondary | ICD-10-CM | POA: Insufficient documentation

## 2015-03-11 DIAGNOSIS — I2510 Atherosclerotic heart disease of native coronary artery without angina pectoris: Secondary | ICD-10-CM | POA: Diagnosis not present

## 2015-03-11 DIAGNOSIS — R222 Localized swelling, mass and lump, trunk: Secondary | ICD-10-CM

## 2015-11-07 ENCOUNTER — Inpatient Hospital Stay (HOSPITAL_COMMUNITY)
Admission: EM | Admit: 2015-11-07 | Discharge: 2015-11-13 | DRG: 470 | Disposition: A | Discharge status: Skilled Nursing Facility | Payer: Medicare Other | Attending: Orthopedic Surgery | Admitting: Orthopedic Surgery

## 2015-11-07 ENCOUNTER — Encounter (HOSPITAL_COMMUNITY): Payer: Self-pay | Admitting: Cardiology

## 2015-11-07 ENCOUNTER — Emergency Department (HOSPITAL_COMMUNITY): Payer: Medicare Other

## 2015-11-07 DIAGNOSIS — S72002A Fracture of unspecified part of neck of left femur, initial encounter for closed fracture: Secondary | ICD-10-CM | POA: Diagnosis present

## 2015-11-07 DIAGNOSIS — E038 Other specified hypothyroidism: Secondary | ICD-10-CM

## 2015-11-07 DIAGNOSIS — I739 Peripheral vascular disease, unspecified: Secondary | ICD-10-CM | POA: Diagnosis present

## 2015-11-07 DIAGNOSIS — F329 Major depressive disorder, single episode, unspecified: Secondary | ICD-10-CM | POA: Diagnosis present

## 2015-11-07 DIAGNOSIS — G309 Alzheimer's disease, unspecified: Secondary | ICD-10-CM | POA: Diagnosis present

## 2015-11-07 DIAGNOSIS — F0280 Dementia in other diseases classified elsewhere without behavioral disturbance: Secondary | ICD-10-CM | POA: Diagnosis not present

## 2015-11-07 DIAGNOSIS — Z79899 Other long term (current) drug therapy: Secondary | ICD-10-CM

## 2015-11-07 DIAGNOSIS — W19XXXA Unspecified fall, initial encounter: Secondary | ICD-10-CM

## 2015-11-07 DIAGNOSIS — S72009A Fracture of unspecified part of neck of unspecified femur, initial encounter for closed fracture: Secondary | ICD-10-CM | POA: Diagnosis present

## 2015-11-07 DIAGNOSIS — E039 Hypothyroidism, unspecified: Secondary | ICD-10-CM | POA: Diagnosis present

## 2015-11-07 DIAGNOSIS — E440 Moderate protein-calorie malnutrition: Secondary | ICD-10-CM | POA: Insufficient documentation

## 2015-11-07 DIAGNOSIS — R4182 Altered mental status, unspecified: Secondary | ICD-10-CM | POA: Diagnosis present

## 2015-11-07 DIAGNOSIS — W1830XA Fall on same level, unspecified, initial encounter: Secondary | ICD-10-CM | POA: Diagnosis present

## 2015-11-07 DIAGNOSIS — S42212A Unspecified displaced fracture of surgical neck of left humerus, initial encounter for closed fracture: Secondary | ICD-10-CM

## 2015-11-07 DIAGNOSIS — S42225A 2-part nondisplaced fracture of surgical neck of left humerus, initial encounter for closed fracture: Secondary | ICD-10-CM | POA: Diagnosis not present

## 2015-11-07 DIAGNOSIS — E876 Hypokalemia: Secondary | ICD-10-CM | POA: Diagnosis present

## 2015-11-07 DIAGNOSIS — G301 Alzheimer's disease with late onset: Secondary | ICD-10-CM | POA: Diagnosis not present

## 2015-11-07 DIAGNOSIS — K219 Gastro-esophageal reflux disease without esophagitis: Secondary | ICD-10-CM | POA: Diagnosis present

## 2015-11-07 DIAGNOSIS — S42292A Other displaced fracture of upper end of left humerus, initial encounter for closed fracture: Secondary | ICD-10-CM | POA: Diagnosis present

## 2015-11-07 HISTORY — DX: Dementia in other diseases classified elsewhere, unspecified severity, without behavioral disturbance, psychotic disturbance, mood disturbance, and anxiety: F02.80

## 2015-11-07 HISTORY — DX: Alzheimer's disease, unspecified: G30.9

## 2015-11-07 HISTORY — DX: Dysphagia, unspecified: R13.10

## 2015-11-07 HISTORY — DX: Major depressive disorder, single episode, unspecified: F32.9

## 2015-11-07 HISTORY — DX: Peripheral vascular disease, unspecified: I73.9

## 2015-11-07 LAB — BASIC METABOLIC PANEL
ANION GAP: 6 (ref 5–15)
BUN: 16 mg/dL (ref 6–20)
CO2: 29 mmol/L (ref 22–32)
Calcium: 8.5 mg/dL — ABNORMAL LOW (ref 8.9–10.3)
Chloride: 102 mmol/L (ref 101–111)
Creatinine, Ser: 0.81 mg/dL (ref 0.44–1.00)
Glucose, Bld: 148 mg/dL — ABNORMAL HIGH (ref 65–99)
POTASSIUM: 3.1 mmol/L — AB (ref 3.5–5.1)
SODIUM: 137 mmol/L (ref 135–145)

## 2015-11-07 LAB — CBC WITH DIFFERENTIAL/PLATELET
BASOS PCT: 0 %
Basophils Absolute: 0 10*3/uL (ref 0.0–0.1)
EOS ABS: 0.1 10*3/uL (ref 0.0–0.7)
EOS PCT: 1 %
HCT: 44.4 % (ref 36.0–46.0)
HEMOGLOBIN: 14.8 g/dL (ref 12.0–15.0)
LYMPHS ABS: 0.6 10*3/uL — AB (ref 0.7–4.0)
Lymphocytes Relative: 4 %
MCH: 31.4 pg (ref 26.0–34.0)
MCHC: 33.3 g/dL (ref 30.0–36.0)
MCV: 94.3 fL (ref 78.0–100.0)
Monocytes Absolute: 0.6 10*3/uL (ref 0.1–1.0)
Monocytes Relative: 5 %
NEUTROS PCT: 90 %
Neutro Abs: 11.1 10*3/uL — ABNORMAL HIGH (ref 1.7–7.7)
PLATELETS: 162 10*3/uL (ref 150–400)
RBC: 4.71 MIL/uL (ref 3.87–5.11)
RDW: 14.1 % (ref 11.5–15.5)
WBC: 12.4 10*3/uL — AB (ref 4.0–10.5)

## 2015-11-07 LAB — PROTIME-INR
INR: 1.17
PROTHROMBIN TIME: 15 s (ref 11.4–15.2)

## 2015-11-07 MED ORDER — ENOXAPARIN SODIUM 40 MG/0.4ML ~~LOC~~ SOLN: 40.0000 mg | SUBCUTANEOUS | Status: DC

## 2015-11-07 MED ORDER — MORPHINE SULFATE (PF) 2 MG/ML IV SOLN
0.5000 mg | INTRAVENOUS | Status: DC | PRN
  Administered 2015-11-07 – 2015-11-12 (×12): 0.5 mg via INTRAVENOUS
  Filled 2015-11-07 (×12): qty 1

## 2015-11-07 MED ORDER — SACCHAROMYCES BOULARDII 250 MG PO CAPS
250.0000 mg | ORAL_CAPSULE | Freq: Every day | ORAL | Status: DC
  Administered 2015-11-08 – 2015-11-12 (×4): 250 mg via ORAL
  Filled 2015-11-07 (×4): qty 1

## 2015-11-07 MED ORDER — ASPIRIN 81 MG PO CHEW
81.0000 mg | CHEWABLE_TABLET | Freq: Every day | ORAL | Status: DC
  Administered 2015-11-08: 81 mg via ORAL
  Filled 2015-11-07: qty 1

## 2015-11-07 MED ORDER — HYDROCODONE-ACETAMINOPHEN 5-325 MG PO TABS
1.0000 | ORAL_TABLET | Freq: Four times a day (QID) | ORAL | Status: DC | PRN
  Administered 2015-11-08: 1 via ORAL
  Filled 2015-11-07: qty 2
  Filled 2015-11-07 (×2): qty 1

## 2015-11-07 MED ORDER — SODIUM CHLORIDE 0.45 % IV SOLN
INTRAVENOUS | Status: AC
  Administered 2015-11-07 – 2015-11-08 (×2): via INTRAVENOUS

## 2015-11-07 MED ORDER — DOCUSATE SODIUM 100 MG PO CAPS
100.0000 mg | ORAL_CAPSULE | Freq: Two times a day (BID) | ORAL | Status: DC
  Administered 2015-11-07 – 2015-11-10 (×6): 100 mg via ORAL
  Filled 2015-11-07 (×7): qty 1

## 2015-11-07 MED ORDER — LEVOTHYROXINE SODIUM 88 MCG PO TABS
88.0000 ug | ORAL_TABLET | Freq: Every day | ORAL | Status: DC
  Administered 2015-11-08 – 2015-11-12 (×4): 88 ug via ORAL
  Filled 2015-11-07 (×4): qty 1

## 2015-11-07 MED ORDER — MORPHINE SULFATE (PF) 2 MG/ML IV SOLN: 1.0000 mg | INTRAVENOUS | Status: DC | PRN

## 2015-11-07 MED ORDER — FENTANYL CITRATE (PF) 100 MCG/2ML IJ SOLN
25.0000 ug | Freq: Once | INTRAMUSCULAR | Status: AC
  Administered 2015-11-07: 25 ug via INTRAVENOUS
  Filled 2015-11-07: qty 2

## 2015-11-07 NOTE — ED Triage Notes (Signed)
[facility]  Witnessed fall by staff.  Per nurse,  Pt was standing in day room and just fell over.  No LOC.  Pt holding pelvic area and hollaring.

## 2015-11-07 NOTE — ED Notes (Signed)
MD at bedside. 

## 2015-11-07 NOTE — H&P (Deleted)
[facility] History and Physical  Kristin AlvineDixie M Pfalzgraf ZOX:[id number] DOB: 02/09/1930 DOA: 11/07/2015  Referring physician: Dr. Bebe ShaggyWickline PCP: Fredirick MaudlinHAWKINS,EDWARD L, MD   Chief Complaint: Fall w L hip fracture  HPI: [name] is a 82 y.o. female wit hx of Alzheimer's, depression and PVD presented today [facility], "just fell over" per ED notes.   In ED found to have L hip fx and L humeral head fracture.  Asked to see for admit for L hip fracture.    Pt's daughter and sister provide history.  Patient has dementia and was put in SNF in 2014 after her sister's husband died.  He had been taking care of her.  She had been widowed yrs before.  Over the last 3 yrs has had sig decline in activity and memory,  Over the last 6 mos particularly has had decline.  Per family her wishes are for no heroic measure/ no life support .  No hx heart disease, afib or COPD.     Meds at SNF > asa, T4, NTG SL, florastor, vit D/ ergocal, melatonin  Chart review: Sept '13 - confused, UTI, rx w IV abx.  Iproved.  DC's home w HHS.    May '14 - a/chron AMS/ combativeness, found to have UTI.  CNSS.  IV abx. DC'd to placement at memory unit.    ROS  n/a/   Past Medical History  Past Medical History:  Diagnosis Date  . Alzheimer's dementia   . Dementia   . Dysphagia   . Hypokalemia   . Major depressive disorder   . PVD (peripheral vascular disease) (HCC)   . Thyroid disease    Past Surgical History  Past Surgical History:  Procedure Laterality Date  . HEMORROIDECTOMY     Family History History reviewed. No pertinent family history. Social History  reports that she has never smoked. She has never used smokeless tobacco. She reports that she does not drink alcohol or use drugs. Allergies  Allergies  Allergen Reactions  . Penicillins    Home medications Prior to Admission medications   Medication Sig Start Date End Date Taking? Authorizing Provider  aspirin 81 MG chewable tablet  Chew 81 mg by mouth daily.   Yes Historical Provider, MD  levothyroxine (SYNTHROID, LEVOTHROID) 88 MCG tablet Take 88 mcg by mouth daily.     Yes Historical Provider, MD  Melatonin 3 MG TABS Take 1 tablet by mouth at bedtime.   Yes Historical Provider, MD  nitroGLYCERIN (NITROSTAT) 0.4 MG SL tablet Place 0.4 mg under the tongue every 5 (five) minutes as needed for chest pain.   Yes Historical Provider, MD  saccharomyces boulardii (FLORASTOR) 250 MG capsule Take 250 mg by mouth daily. For 28 days.   Yes Historical Provider, MD  Vitamin D, Ergocalciferol, (DRISDOL) 50000 units CAPS capsule Take 50,000 Units by mouth every 7 (seven) days.   Yes Historical Provider, MD   Liver Function Tests No results for input(s): AST, ALT, ALKPHOS, BILITOT, PROT, ALBUMIN in the last 168 hours. No results for input(s): LIPASE, AMYLASE in the last 168 hours. CBC  Recent Labs Lab 11/07/15 1434  WBC 12.4*  NEUTROABS 11.1*  HGB 14.8  HCT 44.4  MCV 94.3  PLT 162   Basic Metabolic Panel  Recent Labs Lab 11/07/15 1434  NA 137  K 3.1*  CL 102  CO2 29  GLUCOSE 148*  BUN 16  CREATININE 0.81  CALCIUM 8.5*     Vitals:  11/07/15 1334 11/07/15 1400 11/07/15 1430 11/07/15 1530  BP: (!) 137/106 110/58 145/77 139/68  Pulse: 73 71 70   Resp: 18  15 16   Temp: 97.1 F (36.2 C)     TempSrc: Oral     SpO2: 94% 96% 93%   Weight: 59 kg (130 lb)     Height: 5\' 4"  (1.626 m)      Exam: Gen chron ill appearing, tired, confused, rambling and mumbling No rash, cyanosis or gangrene Sclera anicteric, throat clear  No jvd or bruits Chest clear bilat RRR no MRG Abd soft ntnd no mass or ascites +bs GU defer MS no joint effusions or deformity Ext no LE edema / no wounds or ulcers Neuro is awake, confused, resists exam, will not follow commands   Na 137 K 3.1 Creat 0.81  Ca 8.5  WBC 12 Hb 14   INR 1.17  EKG (independ reviewed) > NSR 62 bpm, RBBB/ LAFB, QRS 122 msec, no acute changes CXR (independ  reviewed) > normal cardiac size, poss vasc congestion   Assessment: 1. Fall / L hip fracture and L humerus fracture 2. Alzheimer's dementia 3. Hypothyroidism  Plan - admit, per ortho no plans for surg today or tomorrow, likely Tuesday.  Also family will consider non-operative management given risks in pt w advanced dementia.  Family says the pt's daughter has POA but is out of town so her son will make any decisions.      Maree Krabbe [facility] Pager [phone]  Cell [phone]  If 7PM-7AM, please contact night-coverage [email or web address] Password [email or web address] 11/07/2015, 4:29 PM

## 2015-11-07 NOTE — ED Provider Notes (Signed)
[facility]-EMERGENCY DEPT Provider Note   CSN: [id number] Arrival date & time: 11/07/15  1327  By signing my name below, I, Placido Sou, attest that this documentation has been prepared under the direction and in the presence of Zadie Rhine, MD. Electronically Signed: Placido Sou, ED Scribe. 11/07/15. 1:52 PM.   History   Chief Complaint Chief Complaint  Patient presents with  . Fall   LEVEL FIVE CAVEAT: DEMENTIA   HPI HPI Comments: [name] is a 82 y.o. female with a h/o dementia who presents to the Emergency Department by ambulance from [facility] due to a witnessed fall that occurred PTA. Per nurse, she was informed by EMS that she fell to the floor and landed on her left hip. She has not been given any medications for pain. Pt does not clearly answer provider's questions.   The history is provided by the patient, medical records and the EMS personnel. The history is limited by the condition of the patient. No language interpreter was used.    Past Medical History:  Diagnosis Date  . Alzheimer's dementia   . Dementia   . Dysphagia   . Hypokalemia   . Major depressive disorder   . PVD (peripheral vascular disease) (HCC)   . Thyroid disease     Patient Active Problem List   Diagnosis Date Noted  . Alzheimer's dementia 06/18/2012  . Altered mental status 06/16/2012  . Dementia 10/15/2011  . Complicated UTI (urinary tract infection) 10/09/2011  . Acute renal failure (HCC) 10/09/2011  . Hypokalemia 10/09/2011  . Hypotension 10/09/2011  . GERD (gastroesophageal reflux disease) 10/09/2011  . Hypothyroidism 10/09/2011    Past Surgical History:  Procedure Laterality Date  . HEMORROIDECTOMY      OB History    No data available       Home Medications    Prior to Admission medications   Medication Sig Start Date End Date Taking? Authorizing Provider  doxycycline (VIBRA-TABS) 100 MG tablet Take 1 tablet (100 mg total) by  mouth every 12 (twelve) hours. 06/19/12   Kari Baars, MD  ibuprofen (ADVIL,MOTRIN) 200 MG tablet Take 400 mg by mouth every 8 (eight) hours as needed. pain    Historical Provider, MD  levothyroxine (SYNTHROID, LEVOTHROID) 88 MCG tablet Take 88 mcg by mouth daily.      Historical Provider, MD  LORazepam (ATIVAN) 0.5 MG tablet Take 0.5 mg by mouth every 6 (six) hours as needed for anxiety.    Historical Provider, MD  potassium chloride SA (K-DUR,KLOR-CON) 20 MEQ tablet Take 1 tablet (20 mEq total) by mouth 2 (two) times daily. 06/14/12   Donnetta Hutching, MD    Family History History reviewed. No pertinent family history.  Social History Social History  Substance Use Topics  . Smoking status: Never Smoker  . Smokeless tobacco: Never Used  . Alcohol use No     Allergies   Penicillins   Review of Systems Review of Systems  Unable to perform ROS: Dementia   Physical Exam Updated Vital Signs BP 139/68   Pulse 70   Temp 97.1 F (36.2 C) (Oral)   Resp 16   Ht 5\' 4"  (1.626 m)   Wt 59 kg   SpO2 93%   BMI 22.31 kg/m   Physical Exam CONSTITUTIONAL: Elderly and frail  HEAD: Normocephalic/atraumatic EYES: EOMI/PERRL ENMT: Mucous membranes moist, no facial trauma  NECK: supple no meningeal signs SPINE/BACK:entire spine nontender CV: S1/S2 noted, no murmurs/rubs/gallops noted LUNGS: Lungs are clear  to auscultation bilaterally, no apparent distress ABDOMEN: soft, nontender, no rebound or guarding, bowel sounds noted throughout abdomen GU:no bruising or signs of trauma to the pelvic region  NEURO: Pt is awake/alert/confused, moves all extremitiesx4.  No facial droop.   EXTREMITIES: pulses normal/equal, LLE shortened and rotated, TTP to the left hip, left knee, left shoulder and left wrist  Pelvis is stable SKIN: warm, color normal.  Rash under bilateral breasts.  No bruising noted to chest wall PSYCH: agitated   ED Treatments / Results  Labs (all labs ordered are listed, but only  abnormal results are displayed) Labs Reviewed  BASIC METABOLIC PANEL - Abnormal; Notable for the following:       Result Value   Potassium 3.1 (*)    Glucose, Bld 148 (*)    Calcium 8.5 (*)    All other components within normal limits  CBC WITH DIFFERENTIAL/PLATELET - Abnormal; Notable for the following:    WBC 12.4 (*)    Neutro Abs 11.1 (*)    Lymphs Abs 0.6 (*)    All other components within normal limits  PROTIME-INR  TYPE AND SCREEN    EKG  EKG Interpretation  Date/Time:  Sunday November 07 2015 14:29:56 EDT Ventricular Rate:  68 PR Interval:    QRS Duration: 122 QT Interval:  449 QTC Calculation: 478 R Axis:   -64 Text Interpretation:  Sinus rhythm RBBB and LAFB Probable left ventricular hypertrophy artifact noted Abnormal ekg No previous ECGs available Confirmed by Bebe ShaggyWICKLINE  MD, Vernis Eid ([id number]) on 11/07/2015 2:37:15 PM       Radiology Dg Chest 1 View  Result Date: 11/07/2015 CLINICAL DATA:  Dementia.  Witnessed fall. EXAM: CHEST 1 VIEW COMPARISON:  CT 03/11/2015 FINDINGS: Normal cardiac silhouette. Lung bases are difficult to evaluate. No effusion, infiltrate pneumothorax. Chronic bronchitic markings. IMPRESSION: Chronic bronchitic markings.  No acute findings. Electronically Signed   By: Genevive BiStewart  Edmunds M.D.   On: 11/07/2015 15:52   Dg Forearm Left  Result Date: 11/07/2015 CLINICAL DATA:  Witnessed fall.  Dementia. EXAM: LEFT FOREARM - 2 VIEW COMPARISON:  Left hand radiographs 03/29/2007. FINDINGS: The bones appear mildly demineralized. No evidence of acute fracture or dislocation. No evidence of elbow joint effusion. There is an IV in the antecubital fossa. There are intercarpal degenerative changes which appear mildly progressive from prior radiographs. IMPRESSION: No acute osseous findings seen within the left forearm. Electronically Signed   By: Carey BullocksWilliam  Veazey M.D.   On: 11/07/2015 15:49   Dg Shoulder Left  Result Date: 11/07/2015 CLINICAL DATA:  Left hip and  shoulder pain after falling.  Dementia. EXAM: LEFT SHOULDER - 2+ VIEW COMPARISON:  Limited comparison made with chest CT 03/11/2015. FINDINGS: AP and Y-views are submitted. There is a nondisplaced fracture of the left humeral neck which is only seen on the AP view. Small ossific density along the superior glenoid appears nonacute. There is no evidence of dislocation. Mild acromioclavicular degenerative changes are present. IMPRESSION: Nondisplaced fracture of the left humeral neck. Electronically Signed   By: Carey BullocksWilliam  Veazey M.D.   On: 11/07/2015 15:47   Dg Knee Complete 4 Views Left  Result Date: 11/07/2015 CLINICAL DATA:  Witnessed fall.  Left hip and knee pain.  Dementia. EXAM: LEFT KNEE - COMPLETE 4+ VIEW COMPARISON:  None. FINDINGS: Positioning is mildly limited by the patient's left hip injury. The mineralization and alignment appear adequate. No evidence of acute fracture or dislocation. There is mild medial compartment joint space narrowing. No significant joint  effusion. IMPRESSION: No acute findings demonstrated at the left knee. Mild medial compartment joint space narrowing. Electronically Signed   By: Carey Bullocks M.D.   On: 11/07/2015 15:45   Dg Hip Unilat With Pelvis 2-3 Views Left  Result Date: 11/07/2015 CLINICAL DATA:  Witnessed fall.  Left hip and knee pain.  Dementia. EXAM: DG HIP (WITH OR WITHOUT PELVIS) 2-3V LEFT COMPARISON:  Right hip radiographs 01/06/2011. FINDINGS: Mineralization appears adequate for age. There is a superiorly displaced fracture of the left femoral neck. The femoral head is located. No evidence of pelvic fracture. Mild lower lumbar spondylosis noted. IMPRESSION: Moderately displaced fracture of the left femoral neck. No dislocation. Electronically Signed   By: Carey Bullocks M.D.   On: 11/07/2015 15:44    Procedures Procedures  COORDINATION OF CARE: 1:52 PM Pt has a h/o dementia. Will move forward with next steps based on physical exam, patient's history  and details provided by EMS and the facility staff.  Medications Ordered in ED Medications  fentaNYL (SUBLIMAZE) injection 25 mcg (25 mcg Intravenous Given 11/07/15 1402)     Initial Impression / Assessment and Plan / ED Course  I have reviewed the triage vital signs and the nursing notes.  Pertinent labs & imaging results that were available during my care of the patient were reviewed by me and considered in my medical decision making (see chart for details).  Clinical Course   2:28 PM Spoke to nursing staff at [facility] - pt stood up and fell on left side No head injury No LOC Pt appeared to injured her left leg and was sent for evaluation Son at bedside and updated on plan of care  4:22 PM Pt with left femoral neck fx Also with nondisplaced left humerus fx D/w dr Romeo Apple - surgery likely on 10/3 D/w dr Arlean Hopping with [facility] Family updated on plan  I personally performed the services described in this documentation, which was scribed in my presence. The recorded information has been reviewed and is accurate.      Final Clinical Impressions(s) / ED Diagnoses   Final diagnoses:  Fall, initial encounter  Left displaced femoral neck fracture (HCC)  Fx humeral neck, left, closed, initial encounter    New Prescriptions New Prescriptions   No medications on file     Zadie Rhine, MD 11/07/15 1623

## 2015-11-07 NOTE — H&P (Signed)
[facility] History and Physical  Kristin AlvineDixie M Amedee ZHY:[id number] DOB: 02/05/1931 DOA: 11/07/2015  Referring physician: Dr. Bebe ShaggyWickline PCP: Fredirick MaudlinHAWKINS,EDWARD L, MD   Chief Complaint: Fall w L hip fracture  HPI: [name] is a 82 y.o. female wit hx of Alzheimer's, depression and PVD presented today [facility], "just fell over" per ED notes.   In ED found to have L hip fx and L humeral head fracture.  Asked to see for admit for L hip fracture.    Pt's daughter and sister provide history.  Patient has dementia and was put in SNF in 2014 after her sister's husband died.  He had been taking care of her.  She had been widowed yrs before.  Over the last 3 yrs has had sig decline in activity and memory,  Over the last 6 mos particularly has had decline.  Per family her wishes are for no heroic measure/ no life support .  No hx heart disease, afib or COPD.     Meds at SNF > asa, T4, NTG SL, florastor, vit D/ ergocal, melatonin  Chart review: Sept '13 - confused, UTI, rx w IV abx.  Iproved.  DC's home w HHS.    May '14 - a/chron AMS/ combativeness, found to have UTI.  CNSS.  IV abx. DC'd to placement at memory unit.    ROS  n/a/   Past Medical History  Past Medical History:  Diagnosis Date  . Alzheimer's dementia   . Dementia   . Dysphagia   . Hypokalemia   . Major depressive disorder   . PVD (peripheral vascular disease) (HCC)   . Thyroid disease    Past Surgical History  Past Surgical History:  Procedure Laterality Date  . HEMORROIDECTOMY     Family History History reviewed. No pertinent family history. Social History  reports that she has never smoked. She has never used smokeless tobacco. She reports that she does not drink alcohol or use drugs. Allergies  Allergies  Allergen Reactions  . Penicillins    Home medications Prior to Admission medications   Medication Sig Start Date End Date Taking? Authorizing Provider  aspirin 81 MG chewable tablet  Chew 81 mg by mouth daily.   Yes Historical Provider, MD  levothyroxine (SYNTHROID, LEVOTHROID) 88 MCG tablet Take 88 mcg by mouth daily.     Yes Historical Provider, MD  Melatonin 3 MG TABS Take 1 tablet by mouth at bedtime.   Yes Historical Provider, MD  nitroGLYCERIN (NITROSTAT) 0.4 MG SL tablet Place 0.4 mg under the tongue every 5 (five) minutes as needed for chest pain.   Yes Historical Provider, MD  saccharomyces boulardii (FLORASTOR) 250 MG capsule Take 250 mg by mouth daily. For 28 days.   Yes Historical Provider, MD  Vitamin D, Ergocalciferol, (DRISDOL) 50000 units CAPS capsule Take 50,000 Units by mouth every 7 (seven) days.   Yes Historical Provider, MD   Liver Function Tests No results for input(s): AST, ALT, ALKPHOS, BILITOT, PROT, ALBUMIN in the last 168 hours. No results for input(s): LIPASE, AMYLASE in the last 168 hours. CBC  Recent Labs Lab 11/07/15 1434  WBC 12.4*  NEUTROABS 11.1*  HGB 14.8  HCT 44.4  MCV 94.3  PLT 162   Basic Metabolic Panel  Recent Labs Lab 11/07/15 1434  NA 137  K 3.1*  CL 102  CO2 29  GLUCOSE 148*  BUN 16  CREATININE 0.81  CALCIUM 8.5*     Vitals:  11/07/15 1430 11/07/15 1530 11/07/15 1745 11/07/15 2139  BP: 145/77 139/68 (!) 119/95 (!) 124/53  Pulse: 70  (!) 110 82  Resp: 15 16 16 14   Temp:   98.4 F (36.9 C) 98.2 F (36.8 C)  TempSrc:   Oral Oral  SpO2: 93% 94% 92% 94%  Weight:   64.6 kg (142 lb 8 oz)   Height:   5\' 4"  (1.626 m)    Exam: Gen chron ill appearing, tired, confused, rambling and mumbling No rash, cyanosis or gangrene Sclera anicteric, throat clear  No jvd or bruits Chest clear bilat RRR no MRG Abd soft ntnd no mass or ascites +bs GU defer MS no joint effusions or deformity Ext no LE edema / no wounds or ulcers Neuro is awake, confused, resists exam, will not follow commands   Na 137 K 3.1 Creat 0.81  Ca 8.5  WBC 12 Hb 14   INR 1.17  EKG (independ reviewed) > NSR 62 bpm, RBBB/ LAFB, QRS 122  msec, no acute changes CXR (independ reviewed) > normal cardiac size, poss vasc congestion   Assessment: 1. Fall / L hip fracture and L humerus fracture 2. Alzheimer's dementia 3. Hypothyroidism  Plan - admit, per ortho no plans for surg today or tomorrow, likely Tuesday.  Also family will consider non-operative management given risks in pt w advanced dementia.  Family says the pt's daughter has POA but is out of town so her son will make any decisions.      Maree Krabbe [facility] Pager [phone]  Cell [phone]  If 7PM-7AM, please contact night-coverage [email or web address] Password [email or web address] 11/07/2015, 10:56 PM

## 2015-11-08 DIAGNOSIS — S72002A Fracture of unspecified part of neck of left femur, initial encounter for closed fracture: Principal | ICD-10-CM

## 2015-11-08 DIAGNOSIS — S42225A 2-part nondisplaced fracture of surgical neck of left humerus, initial encounter for closed fracture: Secondary | ICD-10-CM

## 2015-11-08 LAB — ABO/RH: ABO/RH(D): A POS

## 2015-11-08 LAB — PREPARE RBC (CROSSMATCH)

## 2015-11-08 MED ORDER — CLINDAMYCIN PHOSPHATE 900 MG/50ML IV SOLN
900.0000 mg | INTRAVENOUS | Status: DC
  Filled 2015-11-08: qty 50

## 2015-11-08 MED ORDER — ENOXAPARIN SODIUM 40 MG/0.4ML ~~LOC~~ SOLN
40.0000 mg | SUBCUTANEOUS | Status: AC
  Administered 2015-11-08: 40 mg via SUBCUTANEOUS
  Filled 2015-11-08: qty 0.4

## 2015-11-08 MED ORDER — CHLORHEXIDINE GLUCONATE 4 % EX LIQD
60.0000 mL | Freq: Once | CUTANEOUS | Status: AC
  Administered 2015-11-08: 4 via TOPICAL
  Filled 2015-11-08: qty 15

## 2015-11-08 MED ORDER — POVIDONE-IODINE 10 % EX SWAB: 2.0000 "application " | Freq: Once | CUTANEOUS | Status: DC

## 2015-11-08 MED ORDER — POTASSIUM CHLORIDE CRYS ER 20 MEQ PO TBCR
40.0000 meq | EXTENDED_RELEASE_TABLET | Freq: Two times a day (BID) | ORAL | Status: AC
Start: 2015-11-08 — End: 2015-11-09
  Administered 2015-11-08 – 2015-11-09 (×2): 40 meq via ORAL
  Filled 2015-11-08 (×2): qty 2

## 2015-11-08 MED ORDER — SODIUM CHLORIDE 0.9 % IV SOLN: Freq: Once | INTRAVENOUS | Status: DC

## 2015-11-08 MED ORDER — CLINDAMYCIN PHOSPHATE 900 MG/50ML IV SOLN
900.0000 mg | INTRAVENOUS | Status: AC
  Administered 2015-11-09: 900 mg via INTRAVENOUS
  Filled 2015-11-08: qty 50

## 2015-11-08 MED ORDER — ENSURE ENLIVE PO LIQD
237.0000 mL | Freq: Two times a day (BID) | ORAL | Status: DC
  Administered 2015-11-08 – 2015-11-12 (×5): 237 mL via ORAL

## 2015-11-08 NOTE — Consult Note (Signed)
HOSPITAL CONSULT [id number] (HIP FRACTURE )   Patient ID: [name], female   DOB: 1930-05-24, 82 y.o.   MRN: [id number]  New patient  Requested by: DR Felecia Shelling  Reason for: Left hip fracture  Chief Complaint  Patient presents with  . Fall     [address] [name] is a 82 y.o. female.   HPI 82 year old female with dementia mechanical fall sustaining fracture left proximal humerus and left femoral neck hip fracture  Complains of pain left shoulder left hip Duration one day Severity high Quality dull Worse with movement of arm or hip  The patient unable to weight-bear on left side. Review of Systems (all) Review of Systems  Unable to perform ROS: Dementia    Past Medical History:  Diagnosis Date  . Alzheimer's dementia   . Dementia   . Dysphagia   . Hypokalemia   . Major depressive disorder   . PVD (peripheral vascular disease) (HCC)   . Thyroid disease      Past Surgical History:  Procedure Laterality Date  . HEMORROIDECTOMY       History reviewed. No pertinent family history. the family history was reviewed and there was no history of osteoporotic fracture or family history of anesthesia risk factors   Social History Social History  Substance Use Topics  . Smoking status: Never Smoker  . Smokeless tobacco: Never Used  . Alcohol use No     Allergies  Allergen Reactions  . Penicillins     Current Facility-Administered Medications  Medication Dose Route Frequency Provider Last Rate Last Dose  . 0.45 % sodium chloride infusion   Intravenous Continuous Delano Metz, MD 75 mL/hr at 11/07/15 1758    . aspirin chewable tablet 81 mg  81 mg Oral Daily Delano Metz, MD      . docusate sodium (COLACE) capsule 100 mg  100 mg Oral BID Delano Metz, MD   100 mg at 11/07/15 2203  . enoxaparin (LOVENOX) injection 40 mg  40 mg Subcutaneous Q24H Delano Metz, MD      . HYDROcodone-acetaminophen (NORCO/VICODIN) 5-325 MG per tablet 1-2 tablet  1-2 tablet Oral Q6H PRN  Delano Metz, MD      . levothyroxine (SYNTHROID, LEVOTHROID) tablet 88 mcg  88 mcg Oral QAC breakfast Delano Metz, MD      . morphine 2 MG/ML injection 0.5 mg  0.5 mg Intravenous Q2H PRN Delano Metz, MD   0.5 mg at 11/08/15 0358  . potassium chloride SA (K-DUR,KLOR-CON) CR tablet 40 mEq  40 mEq Oral BID Avon Gully, MD      . saccharomyces boulardii (FLORASTOR) capsule 250 mg  250 mg Oral Daily Delano Metz, MD         Physical Exam(=30) Blood pressure 124/64, pulse 79, temperature 98.5 F (36.9 C), temperature source Oral, resp. rate 16, height 5\' 4"  (1.626 m), weight 142 lb 8 oz (64.6 kg), SpO2 95 %. Gen. Appearance Normal appearance no congenital abnormalities Peripheral vascular system no edema right or left ankle Lymph nodes no lymph node enlargement right or left axilla right or left groin Gait unable to ambulate  Left Upper extremity Tenderness proximal left humerus no deformity Shoulder range of motion deferred normal passive motion elbow wrist hand Elbow wrist stable Muscle tone normal Skin clean dry intact no rash lesion or ulceration   Right upper extremity  Inspection revealed no malalignment or asymmetry  Assessment of range of motion: Full range of motion was recorded  Assessment of stability: Elbow wrist  and hand and shoulder were stable  Assessment of muscle strength and tone revealed grade 5 muscle strength and normal muscle tone  Skin was normal without rash lesion or ulceration  Right Lower extremity  Inspection revealed no malalignment or asymmetry  Assessment of range of motion: Full range of motion was recorded  Assessment of stability: Hip knee and ankle stable  Assessment of muscle strength and tone revealed grade 5 muscle strength and normal muscle tone  Skin was normal without rash lesion or ulceration  Left lower extremity  Inspection revealed external rotation shortening left  Assessment of range of motion: Deferred because of pain and  fracture  Assessment of stability: Knee and ankle stable  Assessment of muscle strength and tone revealed normal muscle tone Normal skin   Coordination was not tested secondary to dementia Deep tendon reflexes were deferred because of fractures but no pathologic reflexes were elicited  Examination of sensation by touch was normal in all 4 extremities  Mental status  Oriented to time person and place abnormal in all phases  Mood and affect normal without depression anxiety or agitation  Dx: Left femoral neck fracture displaced  Data Reviewed  I reviewed the images and the reports and my independent interpretation is displaced left femoral neck fracture on x-ray  Left proximal humerus fracture nondisplaced   Assessment  82 year old female with dementia she has 2 fractures. Left hip will require bipolar fracture fixation. Left shoulder require sling  Postop knee post op platform walker  Plan   Surgical fixation October 3.  This procedure has been fully reviewed with the patient and written informed consent has been obtained. With son   Vickki HearingStanley E Harrison MD

## 2015-11-08 NOTE — Care Management Important Message (Signed)
Important Message  Patient Details  Name: Criss AlvineDixie M Afshar MRN: [id number] Date of Birth: 03/28/1930   Medicare Important Message Given:  Yes    Lyndia Bury, Chrystine OilerSharley Diane, RN 11/08/2015, 9:39 AM

## 2015-11-08 NOTE — Progress Notes (Signed)
Subjective: Patient was admitted yesterday due to left hip and left hemurus fracture. She is being evaluated by ortho for surgery.  Objective: Vital signs in last 24 hours: Temp:  [97.1 F (36.2 C)-98.5 F (36.9 C)] 98.5 F (36.9 C) (10/02 0539) Pulse Rate:  [70-110] 79 (10/02 0539) Resp:  [14-18] 16 (10/02 0539) BP: (110-145)/(53-106) 124/64 (10/02 0539) SpO2:  [92 %-96 %] 95 % (10/02 0539) Weight:  [59 kg (130 lb)-64.6 kg (142 lb 8 oz)] 64.6 kg (142 lb 8 oz) (10/01 1745) Weight change:     Intake/Output from previous day: 10/01 0701 - 10/02 0700 In: 1022.5 [P.O.:120; I.V.:902.5] Out: -   PHYSICAL EXAM General appearance: fatigued and no distress Resp: clear to auscultation bilaterally Cardio: S1, S2 normal GI: soft, non-tender; bowel sounds normal; no masses,  no organomegaly Extremities: tenderswelling of the left hip and left shoulder  Lab Results:  Results for orders placed or performed during the hospital encounter of 11/07/15 (from the past 48 hour(s))  Basic metabolic panel     Status: Abnormal   Collection Time: 11/07/15  2:34 PM  Result Value Ref Range   Sodium 137 135 - 145 mmol/L   Potassium 3.1 (L) 3.5 - 5.1 mmol/L   Chloride 102 101 - 111 mmol/L   CO2 29 22 - 32 mmol/L   Glucose, Bld 148 (H) 65 - 99 mg/dL   BUN 16 6 - 20 mg/dL   Creatinine, Ser 0.81 0.44 - 1.00 mg/dL   Calcium 8.5 (L) 8.9 - 10.3 mg/dL   GFR calc non Af Amer >60 >60 mL/min   GFR calc Af Amer >60 >60 mL/min    Comment: (NOTE) The eGFR has been calculated using the CKD EPI equation. This calculation has not been validated in all clinical situations. eGFR's persistently <60 mL/min signify possible Chronic Kidney Disease.    Anion gap 6 5 - 15  CBC WITH DIFFERENTIAL     Status: Abnormal   Collection Time: 11/07/15  2:34 PM  Result Value Ref Range   WBC 12.4 (H) 4.0 - 10.5 K/uL   RBC 4.71 3.87 - 5.11 MIL/uL   Hemoglobin 14.8 12.0 - 15.0 g/dL   HCT 44.4 36.0 - 46.0 %   MCV 94.3 78.0 -  100.0 fL   MCH 31.4 26.0 - 34.0 pg   MCHC 33.3 30.0 - 36.0 g/dL   RDW 14.1 11.5 - 15.5 %   Platelets 162 150 - 400 K/uL   Neutrophils Relative % 90 %   Neutro Abs 11.1 (H) 1.7 - 7.7 K/uL   Lymphocytes Relative 4 %   Lymphs Abs 0.6 (L) 0.7 - 4.0 K/uL   Monocytes Relative 5 %   Monocytes Absolute 0.6 0.1 - 1.0 K/uL   Eosinophils Relative 1 %   Eosinophils Absolute 0.1 0.0 - 0.7 K/uL   Basophils Relative 0 %   Basophils Absolute 0.0 0.0 - 0.1 K/uL  Protime-INR     Status: None   Collection Time: 11/07/15  2:34 PM  Result Value Ref Range   Prothrombin Time 15.0 11.4 - 15.2 seconds   INR 1.17   Type and screen [facility]     Status: None   Collection Time: 11/07/15  2:34 PM  Result Value Ref Range   ABO/RH(D) A POS    Antibody Screen NEG    Sample Expiration 11/10/2015     ABGS No results for input(s): PHART, PO2ART, TCO2, HCO3 in the last 72 hours.  Invalid input(s): PCO2  CULTURES No results found for this or any previous visit (from the past 240 hour(s)). Studies/Results: Dg Chest 1 View  Result Date: 11/07/2015 CLINICAL DATA:  Dementia.  Witnessed fall. EXAM: CHEST 1 VIEW COMPARISON:  CT 03/11/2015 FINDINGS: Normal cardiac silhouette. Lung bases are difficult to evaluate. No effusion, infiltrate pneumothorax. Chronic bronchitic markings. IMPRESSION: Chronic bronchitic markings.  No acute findings. Electronically Signed   By: Suzy Bouchard M.D.   On: 11/07/2015 15:52   Dg Forearm Left  Result Date: 11/07/2015 CLINICAL DATA:  Witnessed fall.  Dementia. EXAM: LEFT FOREARM - 2 VIEW COMPARISON:  Left hand radiographs 03/29/2007. FINDINGS: The bones appear mildly demineralized. No evidence of acute fracture or dislocation. No evidence of elbow joint effusion. There is an IV in the antecubital fossa. There are intercarpal degenerative changes which appear mildly progressive from prior radiographs. IMPRESSION: No acute osseous findings seen within the left forearm.  Electronically Signed   By: Richardean Sale M.D.   On: 11/07/2015 15:49   Dg Shoulder Left  Result Date: 11/07/2015 CLINICAL DATA:  Left hip and shoulder pain after falling.  Dementia. EXAM: LEFT SHOULDER - 2+ VIEW COMPARISON:  Limited comparison made with chest CT 03/11/2015. FINDINGS: AP and Y-views are submitted. There is a nondisplaced fracture of the left humeral neck which is only seen on the AP view. Small ossific density along the superior glenoid appears nonacute. There is no evidence of dislocation. Mild acromioclavicular degenerative changes are present. IMPRESSION: Nondisplaced fracture of the left humeral neck. Electronically Signed   By: Richardean Sale M.D.   On: 11/07/2015 15:47   Dg Knee Complete 4 Views Left  Result Date: 11/07/2015 CLINICAL DATA:  Witnessed fall.  Left hip and knee pain.  Dementia. EXAM: LEFT KNEE - COMPLETE 4+ VIEW COMPARISON:  None. FINDINGS: Positioning is mildly limited by the patient's left hip injury. The mineralization and alignment appear adequate. No evidence of acute fracture or dislocation. There is mild medial compartment joint space narrowing. No significant joint effusion. IMPRESSION: No acute findings demonstrated at the left knee. Mild medial compartment joint space narrowing. Electronically Signed   By: Richardean Sale M.D.   On: 11/07/2015 15:45   Dg Hip Unilat With Pelvis 2-3 Views Left  Result Date: 11/07/2015 CLINICAL DATA:  Witnessed fall.  Left hip and knee pain.  Dementia. EXAM: DG HIP (WITH OR WITHOUT PELVIS) 2-3V LEFT COMPARISON:  Right hip radiographs 01/06/2011. FINDINGS: Mineralization appears adequate for age. There is a superiorly displaced fracture of the left femoral neck. The femoral head is located. No evidence of pelvic fracture. Mild lower lumbar spondylosis noted. IMPRESSION: Moderately displaced fracture of the left femoral neck. No dislocation. Electronically Signed   By: Richardean Sale M.D.   On: 11/07/2015 15:44     Medications: I have reviewed the patient's current medications.  Assesment:  Principal Problem:   Closed displaced fracture of left femoral neck (HCC) Active Problems:   Hypothyroidism   Alzheimer's dementia without behavioral disturbance   Altered mental status   Closed fracture of head of left humerus   Hip fracture (HCC)    Plan:  Medications reviewed Will replace KCL As per orthopedic plan    LOS: 1 day   Chemeka Filice 11/08/2015, 8:09 AM

## 2015-11-08 NOTE — Progress Notes (Signed)
Initial Nutrition Assessment  DOCUMENTATION CODES:  Pt meets criteria for moderate MALNUTRITION in the context of acute injury (left hip fx) as evidenced by mild fat and muscle loss.     INTERVENTION:  Ensure Enlive po BID, each supplement provides 350 kcal and 20 grams of protein   Recommend general healthful diet  Encourage meal intake and assist with feeding   NUTRITION DIAGNOSIS:     Increased protein and energy needs related to  acute injury as evidenced by  left hip fx and est needs.  GOAL:  Pt to meet >/= 90% of their estimated nutrition needs     MONITOR:  Po intake, labs and wt trends     REASON FOR ASSESSMENT:   Consult Assessment of nutrition requirement/status  ASSESSMENT:  Patient is an 82 yo female who [facility]. She has fallen and fractured her left hip and has fx in her left shoulder. She has surgery pending for bipolar fracture fixation. Her hx includes dementia, dysphagia, hypokalemia and major depression.   Patients grandaugther is here and is currently a Theatre stage managernursing student at  [employer]. She is getting set-up to feed the patient. She says her grandmother another  fall about 3 weeks ago. The pt had a URI at the time. She says pt has not been eating as well but is unsure what her weight has been recently.  Nutrition-Focused physical exam completed. Findings are mild fat and muscle loss. No edema.     Recent Labs Lab 11/07/15 1434  NA 137  K 3.1*  CL 102  CO2 29  BUN 16  CREATININE 0.81  CALCIUM 8.5*  GLUCOSE 148*    Abnormal Labs: potassium, glucose  Diet Order:  Diet Heart Room service appropriate? Yes; Fluid consistency: Thin Diet NPO time specified Except for: Sips with Meds  Skin:  Reviewed, no issues  Last BM:  Prior to admission  Height:   Ht Readings from Last 1 Encounters:  11/07/15 5\' 4"  (1.626 m)    Weight:   Wt Readings from Last 1 Encounters:  11/07/15 142 lb 8 oz (64.6 kg)    Ideal Body Weight:  55  kg  BMI:  Body mass index is 24.46 kg/m.  Estimated Nutritional Needs:   Kcal:  1600-1800   Protein:  80-90 gr  Fluid:  1.6-1.8 liters daily  EDUCATION NEEDS:    Royann ShiversLynn Ferol Laiche MS,RD,CSG,LDN Office: [phone] Pager: [phone]

## 2015-11-08 NOTE — Care Management Note (Signed)
Case Management Note  Patient Details  Name: [name] MRN: [id number] Date of Birth: 06/04/1930  Subjective/Objective: [facility] and left shoulder fracture. Bipolar hip scheduled for Oct. 3rd.             Action/Plan: Will follow for needs.   Expected Discharge Date:  11/12/15               Expected Discharge Plan:  Skilled Nursing Facility  In-House Referral:  Clinical Social Work  Discharge planning Services  CM Consult  Post Acute Care Choice:    Choice offered to:     DME Arranged:    DME Agency:     HH Arranged:    HH Agency:     Status of Service:  In process, will continue to follow  If discussed at Long Length of Stay Meetings, dates discussed:    Additional Comments:  Jemimah Cressy, Chrystine OilerSharley Diane, RN 11/08/2015, 9:36 AM

## 2015-11-08 NOTE — Clinical Social Work Note (Signed)
Clinical Social Work Assessment  Patient Details  Name: [name] MRN: [id number] Date of Birth: 02-20-1930  Date of referral:  11/08/15               Reason for consult:  Discharge Planning                Permission sought to share information with:  Chartered certified accountant granted to share information::  Yes, Verbal Permission Granted  Name::        Agency::  [facility]  Relationship::  facility  Contact Information:     Housing/Transportation Living arrangements for the past 2 months:  [place] of Information:  Adult Children Patient Interpreter Needed:  None Criminal Activity/Legal Involvement Pertinent to Current Situation/Hospitalization:  No - Comment as needed Significant Relationships:  Adult Children, Other Family Members Lives with:  Facility Resident Do you feel safe going back to the place where you live?  Yes Need for family participation in patient care:  Yes (Comment)  Care giving concerns:  Pt's son concerned that pt may need more supervision due to falls, but pt is already on memory care unit.     Social Worker assessment / plan:  CSW met with pt's son, Elberta Fortis at bedside. He reports that his daughter, Danae Chen is legal guardian, but she has added him to this as well because she is out of town a lot. Pt is disoriented x4 and has Alzheimer's dementia. Danae Chen is currently in [place] and will be back in town tomorrow per [employer]. He states pt has been a resident at [facility] since 2014. He has noticed a decline in the last few years, significant in the past 6 months. Elberta Fortis reports pt has not been the same since her husband died. Pt feel a few weeks ago at [facility] and then fell again. Admitted due to hip fracture. Pt is currently on memory care unit and is very active per Elberta Fortis. They have considered options and want pt to have surgery. CSW left voicemail for [facility] requesting return call.   Employment status:   Retired Nurse, adult PT Recommendations:  Not assessed at this time Information / Referral to community resources:  Other (Comment Required) (Return to [facility])  Patient/Family's Response to care:  Pt's son anticipates pt will return to [facility] when medically stable.   Patient/Family's Understanding of and Emotional Response to Diagnosis, Current Treatment, and Prognosis:  Pt's son has considered non-operative management and has decided for pt to have surgery. Surgery scheduled for tomorrow.   Emotional Assessment Appearance:  Appears stated age Attitude/Demeanor/Rapport:  Unable to Assess Affect (typically observed):  Unable to Assess Orientation:    Alcohol / Substance use:  Not Applicable Psych involvement (Current and /or in the community):  No (Comment)  Discharge Needs  Concerns to be addressed:  Discharge Planning Concerns Readmission within the last 30 days:  No Current discharge risk:  Physical Impairment Barriers to Discharge:  Continued Medical Work up   Salome Arnt, [place] 11/08/2015, 12:39 PM [phone]

## 2015-11-08 NOTE — Consult Note (Signed)
HOSPITAL CONSULT [phone] (HIP FRACTURE )  Patient ID: [name], female   DOB: [birth year], 82 y.o.   MRN: [id number]  New patient  Requested by: Dr Felecia Shelling   Reason for: left femoral neck hip fracture   Chief Complaint  Patient presents with  . Fall    [address] Judie Petit [name] is a 82 y.o. female.   HPI 82 yo femal e with dementia resides at Nursing home sustained mechanical fall   C/o pain  Location left shoulder and left hip  X 1 day  Severe Dull ache Worse with movement    Review of Systems (all) Review of Systems  Unable to perform ROS: Dementia    Past Medical History:  Diagnosis Date  . Alzheimer's dementia   . Dementia   . Dysphagia   . Hypokalemia   . Major depressive disorder   . PVD (peripheral vascular disease) (HCC)   . Thyroid disease      Past Surgical History:  Procedure Laterality Date  . HEMORROIDECTOMY       History reviewed. No pertinent family history. the family history was reviewed and there was no history of osteoporotic fracture or family history of anesthesia risk factors   Social History Social History  Substance Use Topics  . Smoking status: Never Smoker  . Smokeless tobacco: Never Used  . Alcohol use No     Allergies  Allergen Reactions  . Penicillins     Current Facility-Administered Medications  Medication Dose Route Frequency Provider Last Rate Last Dose  . 0.45 % sodium chloride infusion   Intravenous Continuous Delano Metz, MD 75 mL/hr at 11/07/15 1758    . aspirin chewable tablet 81 mg  81 mg Oral Daily Delano Metz, MD      . docusate sodium (COLACE) capsule 100 mg  100 mg Oral BID Delano Metz, MD   100 mg at 11/07/15 2203  . enoxaparin (LOVENOX) injection 40 mg  40 mg Subcutaneous Q24H Delano Metz, MD      . HYDROcodone-acetaminophen (NORCO/VICODIN) 5-325 MG per tablet 1-2 tablet  1-2 tablet Oral Q6H PRN Delano Metz, MD      . levothyroxine (SYNTHROID, LEVOTHROID) tablet 88 mcg  88 mcg Oral QAC breakfast Delano Metz, MD      . morphine 2 MG/ML injection 0.5 mg  0.5 mg Intravenous Q2H PRN Delano Metz, MD   0.5 mg at 11/08/15 0358  . potassium chloride SA (K-DUR,KLOR-CON) CR tablet 40 mEq  40 mEq Oral BID Avon Gully, MD      . saccharomyces boulardii (FLORASTOR) capsule 250 mg  250 mg Oral Daily Delano Metz, MD         Physical Exam(=30) Blood pressure 124/64, pulse 79, temperature 98.5 F (36.9 C), temperature source Oral, resp. rate 16, height 5\' 4"  (1.626 m), weight 142 lb 8 oz (64.6 kg), SpO2 95 %. Gen. Appearance normal without congenital malformations  Peripheral vascular system no edema in right or left ankle Lymph nodes normal axilla right and left and groin right and left  Gait can not walk fractured hip   Left Upper extremity  Inspection revealed no malalignment but tendernessIn the proximal left arm  Assessment of range of motion: Elbow wrist and hand passive normal shoulder joint range of motion deferred because of fracture  Assessment of stability: Elbow wrist and hand  were stable  Assessment of muscle strength and tone revealed  normal muscle tone  Skin was normal without rash lesion or ulceration  Right upper  extremity  Inspection revealed no malalignment or asymmetry  Assessment of range of motion: Full range of motion was recorded  Assessment of stability: Elbow wrist and hand and shoulder were stable  Assessment of muscle strength and tone revealed grade 5 muscle strength and normal muscle tone  Skin was normal without rash lesion or ulceration  Right Lower extremity  Inspection revealed no malalignment or asymmetry  Assessment of range of motion: Full range of motion was recorded  Assessment of stability: Knee ankle hip normal  Assessment of muscle strength and tone revealed grade 5 muscle strength and normal muscle tone  Skin was normal without rash lesion or ulceration  Left lower extremity  Inspection revealed external rotation short proximal tenderness  left greater trochanter left thigh  Assessment of range of motion: Deferred secondary to hip fracture  Assessment of stability: Knee ankle stable hip deferred because of fracture  Assessment of muscle strength and tone revealed  normal muscle tone  Skin was normal without rash lesion or ulceration   Coordination was not tested because of dementia EAlthough there are no pathologic reflexes the place normal  Mood and affect normal without depression anxiety or agitation  Dx:   Data Reviewed  I reviewed the images and the reports and my independent interpretation is left femoral neck fracture displaced   Assessment  Patient will need left hip replacement surgery partial  Plan   Scheduled for surgery left hip   Vickki HearingStanley E Naraya Stoneberg MD

## 2015-11-09 ENCOUNTER — Encounter (HOSPITAL_COMMUNITY)
Admission: EM | Disposition: A | Discharge status: Skilled Nursing Facility | Payer: Self-pay | Source: Home / Self Care | Attending: Orthopedic Surgery

## 2015-11-09 ENCOUNTER — Encounter (HOSPITAL_COMMUNITY): Payer: Self-pay | Admitting: *Deleted

## 2015-11-09 ENCOUNTER — Inpatient Hospital Stay (HOSPITAL_COMMUNITY): Payer: Medicare Other | Admitting: Anesthesiology

## 2015-11-09 ENCOUNTER — Inpatient Hospital Stay (HOSPITAL_COMMUNITY): Payer: Medicare Other

## 2015-11-09 DIAGNOSIS — E440 Moderate protein-calorie malnutrition: Secondary | ICD-10-CM | POA: Insufficient documentation

## 2015-11-09 HISTORY — PX: HIP ARTHROPLASTY: SHX981

## 2015-11-09 LAB — SURGICAL PCR SCREEN
MRSA, PCR: POSITIVE — AB
STAPHYLOCOCCUS AUREUS: POSITIVE — AB

## 2015-11-09 SURGERY — HEMIARTHROPLASTY, HIP, DIRECT ANTERIOR APPROACH, FOR FRACTURE
Anesthesia: Spinal | Site: Hip | Laterality: Left

## 2015-11-09 MED ORDER — ASPIRIN EC 325 MG PO TBEC
325.0000 mg | DELAYED_RELEASE_TABLET | Freq: Every day | ORAL | Status: DC
  Administered 2015-11-10 – 2015-11-12 (×3): 325 mg via ORAL
  Filled 2015-11-09 (×4): qty 1

## 2015-11-09 MED ORDER — HYDROCODONE-ACETAMINOPHEN 5-325 MG PO TABS
1.0000 | ORAL_TABLET | Freq: Four times a day (QID) | ORAL | Status: DC | PRN
Start: 2015-11-09 — End: 2015-11-13
  Administered 2015-11-10: 1 via ORAL
  Administered 2015-11-10: 2 via ORAL
  Administered 2015-11-10: 1 via ORAL
  Administered 2015-11-11 – 2015-11-12 (×2): 2 via ORAL
  Filled 2015-11-09: qty 2
  Filled 2015-11-09: qty 1
  Filled 2015-11-09: qty 2

## 2015-11-09 MED ORDER — PHENYLEPHRINE 40 MCG/ML (10ML) SYRINGE FOR IV PUSH (FOR BLOOD PRESSURE SUPPORT)
PREFILLED_SYRINGE | INTRAVENOUS | Status: AC
  Filled 2015-11-09: qty 10

## 2015-11-09 MED ORDER — FENTANYL CITRATE (PF) 100 MCG/2ML IJ SOLN
INTRAMUSCULAR | Status: DC | PRN
  Administered 2015-11-09: 12.5 ug via INTRATHECAL
  Administered 2015-11-09: 12.5 ug via INTRAVENOUS

## 2015-11-09 MED ORDER — LACTATED RINGERS IV SOLN
INTRAVENOUS | Status: DC
  Administered 2015-11-09: 1000 mL via INTRAVENOUS

## 2015-11-09 MED ORDER — METOCLOPRAMIDE HCL 5 MG/ML IJ SOLN: 5.0000 mg | Freq: Three times a day (TID) | INTRAMUSCULAR | Status: DC | PRN

## 2015-11-09 MED ORDER — ACETAMINOPHEN 325 MG PO TABS
650.0000 mg | ORAL_TABLET | Freq: Four times a day (QID) | ORAL | Status: DC | PRN
  Administered 2015-11-09: 650 mg via ORAL
  Filled 2015-11-09: qty 2

## 2015-11-09 MED ORDER — BUPIVACAINE IN DEXTROSE 0.75-8.25 % IT SOLN
INTRATHECAL | Status: DC | PRN
Start: 2015-11-09 — End: 2015-11-09
  Administered 2015-11-09: 15 mg via INTRATHECAL

## 2015-11-09 MED ORDER — LIDOCAINE HCL (PF) 1 % IJ SOLN
INTRAMUSCULAR | Status: AC
  Filled 2015-11-09: qty 5

## 2015-11-09 MED ORDER — SODIUM CHLORIDE 0.9 % IR SOLN
Status: DC | PRN
  Administered 2015-11-09: 1000 mL

## 2015-11-09 MED ORDER — PROPOFOL 10 MG/ML IV BOLUS
INTRAVENOUS | Status: AC
  Filled 2015-11-09: qty 20

## 2015-11-09 MED ORDER — ROCURONIUM BROMIDE 50 MG/5ML IV SOLN
INTRAVENOUS | Status: AC
  Filled 2015-11-09: qty 1

## 2015-11-09 MED ORDER — CHLORHEXIDINE GLUCONATE CLOTH 2 % EX PADS
6.0000 | MEDICATED_PAD | Freq: Every day | CUTANEOUS | Status: DC
  Administered 2015-11-09 – 2015-11-12 (×3): 6 via TOPICAL

## 2015-11-09 MED ORDER — MUPIROCIN 2 % EX OINT
1.0000 "application " | TOPICAL_OINTMENT | Freq: Two times a day (BID) | CUTANEOUS | Status: DC
  Administered 2015-11-09 – 2015-11-12 (×7): 1 via NASAL
  Filled 2015-11-09: qty 22

## 2015-11-09 MED ORDER — BUPIVACAINE IN DEXTROSE 0.75-8.25 % IT SOLN
INTRATHECAL | Status: AC
  Filled 2015-11-09: qty 2

## 2015-11-09 MED ORDER — ACETAMINOPHEN 650 MG RE SUPP: 650.0000 mg | Freq: Four times a day (QID) | RECTAL | Status: DC | PRN

## 2015-11-09 MED ORDER — PROPOFOL 500 MG/50ML IV EMUL
INTRAVENOUS | Status: DC | PRN
  Administered 2015-11-09: 25 ug/kg/min via INTRAVENOUS

## 2015-11-09 MED ORDER — CLINDAMYCIN PHOSPHATE 600 MG/50ML IV SOLN
600.0000 mg | Freq: Four times a day (QID) | INTRAVENOUS | Status: AC
  Administered 2015-11-09 (×2): 600 mg via INTRAVENOUS
  Filled 2015-11-09 (×2): qty 50

## 2015-11-09 MED ORDER — FENTANYL CITRATE (PF) 100 MCG/2ML IJ SOLN
INTRAMUSCULAR | Status: AC
  Filled 2015-11-09: qty 2

## 2015-11-09 MED ORDER — METOCLOPRAMIDE HCL 10 MG PO TABS: 5.0000 mg | ORAL_TABLET | Freq: Three times a day (TID) | ORAL | Status: DC | PRN

## 2015-11-09 MED ORDER — LIDOCAINE HCL (CARDIAC) 10 MG/ML IV SOLN
INTRAVENOUS | Status: DC | PRN
  Administered 2015-11-09: 30 mg via INTRAVENOUS

## 2015-11-09 MED ORDER — MIDAZOLAM HCL 2 MG/2ML IJ SOLN
INTRAMUSCULAR | Status: AC
  Filled 2015-11-09: qty 2

## 2015-11-09 MED ORDER — BUPIVACAINE-EPINEPHRINE (PF) 0.5% -1:200000 IJ SOLN
INTRAMUSCULAR | Status: DC | PRN
  Administered 2015-11-09: 60 mL via PERINEURAL

## 2015-11-09 MED ORDER — FENTANYL CITRATE (PF) 100 MCG/2ML IJ SOLN
25.0000 ug | INTRAMUSCULAR | Status: DC | PRN
  Administered 2015-11-09: 25 ug via INTRAVENOUS

## 2015-11-09 MED ORDER — 0.9 % SODIUM CHLORIDE (POUR BTL) OPTIME
TOPICAL | Status: DC | PRN
  Administered 2015-11-09: 1000 mL

## 2015-11-09 MED ORDER — MORPHINE SULFATE (PF) 2 MG/ML IV SOLN: 0.5000 mg | INTRAVENOUS | Status: DC | PRN

## 2015-11-09 MED ORDER — PHENYLEPHRINE HCL 10 MG/ML IJ SOLN
INTRAMUSCULAR | Status: DC | PRN
  Administered 2015-11-09 (×5): 40 ug via INTRAVENOUS

## 2015-11-09 MED ORDER — PHENOL 1.4 % MT LIQD: 1.0000 | OROMUCOSAL | Status: DC | PRN

## 2015-11-09 MED ORDER — HYDROMORPHONE HCL 1 MG/ML IJ SOLN
0.2500 mg | INTRAMUSCULAR | Status: DC | PRN
Start: 2015-11-09 — End: 2015-11-09

## 2015-11-09 MED ORDER — ONDANSETRON HCL 4 MG/2ML IJ SOLN: 4.0000 mg | Freq: Four times a day (QID) | INTRAMUSCULAR | Status: DC | PRN

## 2015-11-09 MED ORDER — MIDAZOLAM HCL 2 MG/2ML IJ SOLN
1.0000 mg | INTRAMUSCULAR | Status: DC | PRN
Start: 2015-11-09 — End: 2015-11-09
  Administered 2015-11-09 (×2): 1 mg via INTRAVENOUS

## 2015-11-09 MED ORDER — EPINEPHRINE HCL 0.1 MG/ML IJ SOSY
PREFILLED_SYRINGE | INTRAMUSCULAR | Status: DC | PRN
  Administered 2015-11-09: .1 ug via INTRATRACHEAL

## 2015-11-09 MED ORDER — ONDANSETRON HCL 4 MG PO TABS: 4.0000 mg | ORAL_TABLET | Freq: Four times a day (QID) | ORAL | Status: DC | PRN

## 2015-11-09 MED ORDER — BUPIVACAINE-EPINEPHRINE (PF) 0.5% -1:200000 IJ SOLN
INTRAMUSCULAR | Status: AC
  Filled 2015-11-09: qty 30

## 2015-11-09 MED ORDER — MENTHOL 3 MG MT LOZG: 1.0000 | LOZENGE | OROMUCOSAL | Status: DC | PRN

## 2015-11-09 SURGICAL SUPPLY — 61 items
BAG HAMPER (MISCELLANEOUS) ×3 IMPLANT
BIT DRILL 2.8X128 (BIT) ×2 IMPLANT
BIT DRILL 2.8X128MM (BIT) ×1
BLADE 10 SAFETY STRL DISP (BLADE) ×3 IMPLANT
BLADE HEX COATED 2.75 (ELECTRODE) ×3 IMPLANT
BLADE SAGITTAL 25.0X1.27X90 (BLADE) ×2 IMPLANT
BLADE SAGITTAL 25.0X1.27X90MM (BLADE) ×1
BRUSH FEMORAL CANAL (MISCELLANEOUS) IMPLANT
CAPT HIP HEMI 1 ×3 IMPLANT
CHLORAPREP W/TINT 10.5 ML (MISCELLANEOUS) ×3 IMPLANT
CHLORAPREP W/TINT 26ML (MISCELLANEOUS) ×6 IMPLANT
CLOTH BEACON ORANGE TIMEOUT ST (SAFETY) ×3 IMPLANT
COVER LIGHT HANDLE STERIS (MISCELLANEOUS) ×6 IMPLANT
COVER PROBE W GEL 5X96 (DRAPES) ×3 IMPLANT
DECANTER SPIKE VIAL GLASS SM (MISCELLANEOUS) ×6 IMPLANT
DRAPE HIP W/POCKET STRL (DRAPE) ×3 IMPLANT
DRSG MEPILEX BORDER 4X12 (GAUZE/BANDAGES/DRESSINGS) ×3 IMPLANT
ELECT REM PT RETURN 9FT ADLT (ELECTROSURGICAL) ×3
ELECTRODE REM PT RTRN 9FT ADLT (ELECTROSURGICAL) ×1 IMPLANT
EVACUATOR 3/16  PVC DRAIN (DRAIN)
EVACUATOR 3/16 PVC DRAIN (DRAIN) IMPLANT
FACESHIELD LNG OPTICON STERILE (SAFETY) ×3 IMPLANT
GLOVE BIO SURGEON STRL SZ7 (GLOVE) ×9 IMPLANT
GLOVE BIOGEL PI IND STRL 7.0 (GLOVE) ×2 IMPLANT
GLOVE BIOGEL PI INDICATOR 7.0 (GLOVE) ×4
GLOVE SKINSENSE NS SZ8.0 LF (GLOVE) ×4
GLOVE SKINSENSE STRL SZ8.0 LF (GLOVE) ×2 IMPLANT
GLOVE SS N UNI LF 8.5 STRL (GLOVE) ×3 IMPLANT
GOWN STRL REUS W/TWL LRG LVL3 (GOWN DISPOSABLE) ×6 IMPLANT
GOWN STRL REUS W/TWL XL LVL3 (GOWN DISPOSABLE) ×3 IMPLANT
HANDPIECE INTERPULSE COAX TIP (DISPOSABLE) ×2
INST SET MAJOR BONE (KITS) ×3 IMPLANT
KIT BLADEGUARD II DBL (SET/KITS/TRAYS/PACK) ×3 IMPLANT
KIT ROOM TURNOVER APOR (KITS) ×3 IMPLANT
MANIFOLD NEPTUNE II (INSTRUMENTS) ×3 IMPLANT
MARKER SKIN DUAL TIP RULER LAB (MISCELLANEOUS) ×3 IMPLANT
NEEDLE HYPO 21X1.5 SAFETY (NEEDLE) ×3 IMPLANT
NS IRRIG 1000ML POUR BTL (IV SOLUTION) ×3 IMPLANT
PACK TOTAL JOINT (CUSTOM PROCEDURE TRAY) ×3 IMPLANT
PAD ARMBOARD 7.5X6 YLW CONV (MISCELLANEOUS) ×3 IMPLANT
PASSER SUT SWANSON 36MM LOOP (INSTRUMENTS) IMPLANT
PILLOW HIP ABDUCTION LRG (ORTHOPEDIC SUPPLIES) IMPLANT
PILLOW HIP ABDUCTION MED (ORTHOPEDIC SUPPLIES) ×3 IMPLANT
PIN STMN SNGL STERILE 9X3.6MM (PIN) ×6 IMPLANT
SET BASIN LINEN APH (SET/KITS/TRAYS/PACK) ×3 IMPLANT
SET HNDPC FAN SPRY TIP SCT (DISPOSABLE) ×1 IMPLANT
STAPLER VISISTAT 35W (STAPLE) ×3 IMPLANT
SUT BRALON NAB BRD #1 30IN (SUTURE) ×6 IMPLANT
SUT ETHIBOND 5 LR DA (SUTURE) ×6 IMPLANT
SUT MNCRL 0 VIOLET CTX 36 (SUTURE) ×1 IMPLANT
SUT MON AB 0 CT1 (SUTURE) ×3 IMPLANT
SUT MON AB 2-0 CT1 36 (SUTURE) ×3 IMPLANT
SUT MONOCRYL 0 CTX 36 (SUTURE) ×2
SUT VIC AB 1 CT1 27 (SUTURE)
SUT VIC AB 1 CT1 27XBRD ANTBC (SUTURE) IMPLANT
SYR 30ML LL (SYRINGE) ×3 IMPLANT
SYR BULB IRRIGATION 50ML (SYRINGE) ×3 IMPLANT
TOWER CARTRIDGE SMART MIX (DISPOSABLE) IMPLANT
TRAY FOLEY CATH SILVER 16FR (SET/KITS/TRAYS/PACK) IMPLANT
WATER STERILE IRR 1000ML POUR (IV SOLUTION) ×6 IMPLANT
YANKAUER SUCT 12FT TUBE ARGYLE (SUCTIONS) ×3 IMPLANT

## 2015-11-09 NOTE — Anesthesia Procedure Notes (Signed)
Procedure Name: MAC Date/Time: 11/09/2015 7:35 AM Performed by: Franco NonesYATES, Marilyne Haseley S Pre-anesthesia Checklist: Patient identified, Emergency Drugs available, Suction available, Timeout performed and Patient being monitored Patient Re-evaluated:Patient Re-evaluated prior to inductionOxygen Delivery Method: Non-rebreather mask

## 2015-11-09 NOTE — Anesthesia Postprocedure Evaluation (Addendum)
Anesthesia Post Note  Patient: [name]  Procedure(s) Performed: Procedure(s) (LRB): ARTHROPLASTY BIPOLAR HIP (HEMIARTHROPLASTY) (Left)  Patient location during evaluation: PACU Anesthesia Type: General Level of consciousness: awake and patient cooperative Pain management: satisfactory to patient Vital Signs Assessment: post-procedure vital signs reviewed and stable Respiratory status: spontaneous breathing and non-rebreather facemask Cardiovascular status: stable Postop Assessment: spinal receding Anesthetic complications: no    Last Vitals:  Vitals:   11/09/15 0730 11/09/15 0927  BP:  (!) 119/58  Pulse:  86  Resp: 19 16  Temp:  36.6 C    Last Pain:  Vitals:   11/09/15 0927  TempSrc:   PainSc: (P) Asleep                 Calliope Delangel

## 2015-11-09 NOTE — Brief Op Note (Addendum)
11/09/2015  9:26 AM  PATIENT:  [name]  82 y.o. female  PRE-OPERATIVE DIAGNOSIS:  left hip fracture, femoral neck displaced  POST-OPERATIVE DIAGNOSIS:  left hip fracture, femoral neck displaced  PROCEDURE:  Procedure(s) with comments: ARTHROPLASTY BIPOLAR HIP (HEMIARTHROPLASTY) (Left) - 0730  SURGEON:  Surgeon(s) and Role:    * Giordana Weinheimer E Bernisha Verma, MD - Primary  PHYSICIAN ASSISTANT:   Assisted by Betty Ashley  Spinal anesthesia  EBL:  Total I/O In: 500 [I.V.:500] Out: 200 [Urine:100; Blood:100]     BLOOD ADMINISTERED:none  DRAINS: none   LOCAL MEDICATIONS USED:  MARCAINE     SPECIMEN:  No Specimen  DISPOSITION OF SPECIMEN:  N/A  COUNTS:  YES  TOURNIQUET:  * No tourniquets in log *  DICTATION: .Dragon Dictation  PLAN OF CARE: Admit to inpatient   PATIENT DISPOSITION:  PACU - hemodynamically stable.   Delay start of Pharmacological VTE agent (>24hrs) due to surgical blood loss or risk of bleeding: yes  Procedure done as follows  Marisha Flemmer identified in the preop area the surgical site confirmed chart reviewed and surgical site marked. Patient taken to surgery given clindamycin 900 mg secondary to penicillin allergy.  Spinal anesthetic was administered without complication  Patient placed in right lateral decubitus position. Left arm secured in a sling secondary to proximal humerus fracture  Patient placed in hip positioner with appropriate padding.  Sterile prep and drape with ChloraPrep was performed  Timeout was completed to confirm procedure and implants. All were in agreement for left bipolar hip replacement  Direct lateral incision was made for direct lateral approach to the hip. Subcutaneous pain is tissue was divided in line with the skin incision. Fascia was split in line with the skin incision as well.  Greater trochanteric bursectomy was completed  Anterior half of the abductor musculature and taken in continuity with the vastus  lateralis was dissected from the femur and reduced retracted proximally. This exposed the hip capsule and a capsulectomy was performed. The hip was dislocated.  We found a femoral neck fracture completely displaced with a normal acetabulum  The acetabulum was debrided and irrigated. The hip was dislocated anteriorly. A provisional neck cut was made using a template.  A pilot hole was made followed by canal finder and box cutter with serial broaching starting with one up to a size 4. Second neck cut was made with the planer.  Trial reduction was made with a +5 and 49 mm hip ball.  This gave an excellent reduction and stability of the hip  The joint surfaces were irrigated. Trial implants were removed  2 drill holes were placed in the greater trochanter. #5 Ethibond suture was passed. The hip was reduced and the vastus lateralis abductor sleeve was repaired anatomically with #1 Bralon suture. The joint capsule was injected with 30 mL of Marcaine with epinephrine. The hip was abducted and the fascia was closed with running #1 Bralon. Hip was reviewed irrigated again. The subfascial layer was injected with 30 mL of Marcaine with epinephrine in the subcutaneous layer was closed with 0 Monocryl in running fashion  Skin was approximated with staples  Leg lengths were confirmed equal.  Sterile dressing was applied and patient was transferred back to regular bed and taken to recovery in stable condition  Postoperative plan patient can be weightbearing as tolerated. She can take aspirin twice a day for 28 days for DVT prophylaxis. Direct lateral hip precautions should be instituted.  Abduction pillow for 6 weeks.  

## 2015-11-09 NOTE — Anesthesia Preprocedure Evaluation (Signed)
Anesthesia Evaluation  Patient identified by MRN, date of birth, ID band Patient confused    Reviewed: Allergy & Precautions, NPO status , Patient's Chart, lab work & pertinent test results  Airway Mallampati: II  TM Distance: <3 FB     Dental  (+) Poor Dentition   Pulmonary    breath sounds clear to auscultation       Cardiovascular + Peripheral Vascular Disease   Rhythm:Regular Rate:Normal     Neuro/Psych PSYCHIATRIC DISORDERS Depression alzheimer's dementia, confused   GI/Hepatic GERD  Medicated and Controlled,  Endo/Other  Hypothyroidism   Renal/GU      Musculoskeletal   Abdominal   Peds  Hematology   Anesthesia Other Findings   Reproductive/Obstetrics                             Anesthesia Physical Anesthesia Plan  ASA: III  Anesthesia Plan: Spinal   Post-op Pain Management:    Induction:   Airway Management Planned: Simple Face Mask  Additional Equipment:   Intra-op Plan:   Post-operative Plan:   Informed Consent: I have reviewed the patients History and Physical, chart, labs and discussed the procedure including the risks, benefits and alternatives for the proposed anesthesia with the patient or authorized representative who has indicated his/her understanding and acceptance.     Plan Discussed with:   Anesthesia Plan Comments: (Son present, discussed anesthesia plan and he agrees.)        Anesthesia Quick Evaluation

## 2015-11-09 NOTE — Clinical Social Work Note (Signed)
CSW confirmed with Carollee [facility]Shannon [facility] for [facility] to return there when medically stable. Pt in surgery this morning. Will continue to follow.  Derenda FennelKara Docia Klar, LCSW [phone]

## 2015-11-09 NOTE — Op Note (Signed)
11/09/2015  9:26 AM  PATIENT:  [name]  82 y.o. female  PRE-OPERATIVE DIAGNOSIS:  left hip fracture, femoral neck displaced  POST-OPERATIVE DIAGNOSIS:  left hip fracture, femoral neck displaced  PROCEDURE:  Procedure(s) with comments: ARTHROPLASTY BIPOLAR HIP (HEMIARTHROPLASTY) (Left) - 0730  SURGEON:  Surgeon(s) and Role:    * Vickki HearingStanley E Harrison, MD - Primary  PHYSICIAN ASSISTANT:   Assisted by Grant NationBetty Ashley  Spinal anesthesia  EBL:  Total I/O In: 500 [I.V.:500] Out: 200 [Urine:100; Blood:100]     BLOOD ADMINISTERED:none  DRAINS: none   LOCAL MEDICATIONS USED:  MARCAINE     SPECIMEN:  No Specimen  DISPOSITION OF SPECIMEN:  N/A  COUNTS:  YES  TOURNIQUET:  * No tourniquets in log *  DICTATION: .Dragon Dictation  PLAN OF CARE: Admit to inpatient   PATIENT DISPOSITION:  PACU - hemodynamically stable.   Delay start of Pharmacological VTE agent (>24hrs) due to surgical blood loss or risk of bleeding: yes  Procedure done as follows  Kristin Hoopsixie Dubeau identified in the preop area the surgical site confirmed chart reviewed and surgical site marked. Patient taken to surgery given clindamycin 900 mg secondary to penicillin allergy.  Spinal anesthetic was administered without complication  Patient placed in right lateral decubitus position. Left arm secured in a sling secondary to proximal humerus fracture  Patient placed in hip positioner with appropriate padding.  Sterile prep and drape with ChloraPrep was performed  Timeout was completed to confirm procedure and implants. All were in agreement for left bipolar hip replacement  Direct lateral incision was made for direct lateral approach to the hip. Subcutaneous pain is tissue was divided in line with the skin incision. Fascia was split in line with the skin incision as well.  Greater trochanteric bursectomy was completed  Anterior half of the abductor musculature and taken in continuity with the vastus  lateralis was dissected from the femur and reduced retracted proximally. This exposed the hip capsule and a capsulectomy was performed. The hip was dislocated.  We found a femoral neck fracture completely displaced with a normal acetabulum  The acetabulum was debrided and irrigated. The hip was dislocated anteriorly. A provisional neck cut was made using a template.  A pilot hole was made followed by canal finder and box cutter with serial broaching starting with one up to a size 4. Second neck cut was made with the planer.  Trial reduction was made with a +5 and 49 mm hip ball.  This gave an excellent reduction and stability of the hip  The joint surfaces were irrigated. Trial implants were removed  2 drill holes were placed in the greater trochanter. #5 Ethibond suture was passed. The hip was reduced and the vastus lateralis abductor sleeve was repaired anatomically with #1 Bralon suture. The joint capsule was injected with 30 mL of Marcaine with epinephrine. The hip was abducted and the fascia was closed with running #1 Bralon. Hip was reviewed irrigated again. The subfascial layer was injected with 30 mL of Marcaine with epinephrine in the subcutaneous layer was closed with 0 Monocryl in running fashion  Skin was approximated with staples  Leg lengths were confirmed equal.  Sterile dressing was applied and patient was transferred back to regular bed and taken to recovery in stable condition  Postoperative plan patient can be weightbearing as tolerated. She can take aspirin twice a day for 28 days for DVT prophylaxis. Direct lateral hip precautions should be instituted.  Abduction pillow for 6 weeks.

## 2015-11-09 NOTE — Progress Notes (Signed)
Notified Dr. Romeo AppleHarrison of pt's fever. No new orders.

## 2015-11-09 NOTE — Progress Notes (Signed)
Subjective: I saw her in the preoperative area. She is confused. She complains of pain when the blood pressure cuff is inflated. She is scheduled for surgery later today.  Objective: Vital signs in last 24 hours: Temp:  [97.1 F (36.2 C)-99.5 F (37.5 C)] 98.8 F (37.1 C) (10/03 0648) Pulse Rate:  [76-84] 84 (10/03 0428) Resp:  [12-27] 19 (10/03 0730) BP: (95-169)/(48-86) 169/84 (10/03 0648) SpO2:  [91 %-98 %] 92 % (10/03 0700) Weight change:  Last BM Date: 11/08/15  Intake/Output from previous day: 10/02 0701 - 10/03 0700 In: 1964 [P.O.:864; I.V.:400] Out: 2750 [Urine:2750]  PHYSICAL EXAM General appearance: alert and Confused. Her left arm is in a sling Resp: clear to auscultation bilaterally Cardio: regular rate and rhythm, S1, S2 normal, no murmur, click, rub or gallop GI: soft, non-tender; bowel sounds normal; no masses,  no organomegaly Extremities: She has left hip fracture  Lab Results:  Results for orders placed or performed during the hospital encounter of 11/07/15 (from the past 48 hour(s))  Basic metabolic panel     Status: Abnormal   Collection Time: 11/07/15  2:34 PM  Result Value Ref Range   Sodium 137 135 - 145 mmol/L   Potassium 3.1 (L) 3.5 - 5.1 mmol/L   Chloride 102 101 - 111 mmol/L   CO2 29 22 - 32 mmol/L   Glucose, Bld 148 (H) 65 - 99 mg/dL   BUN 16 6 - 20 mg/dL   Creatinine, Ser 0.81 0.44 - 1.00 mg/dL   Calcium 8.5 (L) 8.9 - 10.3 mg/dL   GFR calc non Af Amer >60 >60 mL/min   GFR calc Af Amer >60 >60 mL/min    Comment: (NOTE) The eGFR has been calculated using the CKD EPI equation. This calculation has not been validated in all clinical situations. eGFR's persistently <60 mL/min signify possible Chronic Kidney Disease.    Anion gap 6 5 - 15  CBC WITH DIFFERENTIAL     Status: Abnormal   Collection Time: 11/07/15  2:34 PM  Result Value Ref Range   WBC 12.4 (H) 4.0 - 10.5 K/uL   RBC 4.71 3.87 - 5.11 MIL/uL   Hemoglobin 14.8 12.0 - 15.0 g/dL   HCT 44.4 36.0 - 46.0 %   MCV 94.3 78.0 - 100.0 fL   MCH 31.4 26.0 - 34.0 pg   MCHC 33.3 30.0 - 36.0 g/dL   RDW 14.1 11.5 - 15.5 %   Platelets 162 150 - 400 K/uL   Neutrophils Relative % 90 %   Neutro Abs 11.1 (H) 1.7 - 7.7 K/uL   Lymphocytes Relative 4 %   Lymphs Abs 0.6 (L) 0.7 - 4.0 K/uL   Monocytes Relative 5 %   Monocytes Absolute 0.6 0.1 - 1.0 K/uL   Eosinophils Relative 1 %   Eosinophils Absolute 0.1 0.0 - 0.7 K/uL   Basophils Relative 0 %   Basophils Absolute 0.0 0.0 - 0.1 K/uL  Protime-INR     Status: None   Collection Time: 11/07/15  2:34 PM  Result Value Ref Range   Prothrombin Time 15.0 11.4 - 15.2 seconds   INR 1.17   Type and screen [facility]     Status: None (Preliminary result)   Collection Time: 11/07/15  2:34 PM  Result Value Ref Range   ABO/RH(D) A POS    Antibody Screen NEG    Sample Expiration 11/10/2015    Unit Number [id number]    Blood Component Type RED CELLS,LR  Unit division 00    Status of Unit ALLOCATED    Transfusion Status OK TO TRANSFUSE    Crossmatch Result Compatible    Unit Number [id number]    Blood Component Type RED CELLS,LR    Unit division 00    Status of Unit ALLOCATED    Transfusion Status OK TO TRANSFUSE    Crossmatch Result Compatible   Prepare RBC     Status: None   Collection Time: 11/07/15  2:34 PM  Result Value Ref Range   Order Confirmation ORDER PROCESSED BY BLOOD BANK   ABO/Rh     Status: None   Collection Time: 11/07/15  2:34 PM  Result Value Ref Range   ABO/RH(D) A POS   Surgical PCR screen     Status: Abnormal   Collection Time: 11/08/15  8:37 AM  Result Value Ref Range   MRSA, PCR POSITIVE (A) NEGATIVE    Comment: RESULT CALLED TO, READ BACK BY AND VERIFIED WITH: ROSE C ON 11/09/15 AT 0110 BY HAMLETT P    Staphylococcus aureus POSITIVE (A) NEGATIVE    Comment:        The Xpert SA Assay (FDA approved for NASAL specimens in patients over 66 years of age), is one component of a  comprehensive surveillance program.  Test performance has been validated by [facility] for patients greater than or equal to 57 year old. It is not intended to diagnose infection nor to guide or monitor treatment.     ABGS No results for input(s): PHART, PO2ART, TCO2, HCO3 in the last 72 hours.  Invalid input(s): PCO2 CULTURES Recent Results (from the past 240 hour(s))  Surgical PCR screen     Status: Abnormal   Collection Time: 11/08/15  8:37 AM  Result Value Ref Range Status   MRSA, PCR POSITIVE (A) NEGATIVE Final    Comment: RESULT CALLED TO, READ BACK BY AND VERIFIED WITH: ROSE C ON 11/09/15 AT 0110 BY HAMLETT P    Staphylococcus aureus POSITIVE (A) NEGATIVE Final    Comment:        The Xpert SA Assay (FDA approved for NASAL specimens in patients over 40 years of age), is one component of a comprehensive surveillance program.  Test performance has been validated by [facility] for patients greater than or equal to 48 year old. It is not intended to diagnose infection nor to guide or monitor treatment.    Studies/Results: Dg Chest 1 View  Result Date: 11/07/2015 CLINICAL DATA:  Dementia.  Witnessed fall. EXAM: CHEST 1 VIEW COMPARISON:  CT 03/11/2015 FINDINGS: Normal cardiac silhouette. Lung bases are difficult to evaluate. No effusion, infiltrate pneumothorax. Chronic bronchitic markings. IMPRESSION: Chronic bronchitic markings.  No acute findings. Electronically Signed   By: Suzy Bouchard M.D.   On: 11/07/2015 15:52   Dg Forearm Left  Result Date: 11/07/2015 CLINICAL DATA:  Witnessed fall.  Dementia. EXAM: LEFT FOREARM - 2 VIEW COMPARISON:  Left hand radiographs 03/29/2007. FINDINGS: The bones appear mildly demineralized. No evidence of acute fracture or dislocation. No evidence of elbow joint effusion. There is an IV in the antecubital fossa. There are intercarpal degenerative changes which appear mildly progressive from prior radiographs. IMPRESSION: No acute  osseous findings seen within the left forearm. Electronically Signed   By: Richardean Sale M.D.   On: 11/07/2015 15:49   Dg Shoulder Left  Result Date: 11/07/2015 CLINICAL DATA:  Left hip and shoulder pain after falling.  Dementia. EXAM: LEFT SHOULDER - 2+ VIEW COMPARISON:  Limited comparison made with chest CT 03/11/2015. FINDINGS: AP and Y-views are submitted. There is a nondisplaced fracture of the left humeral neck which is only seen on the AP view. Small ossific density along the superior glenoid appears nonacute. There is no evidence of dislocation. Mild acromioclavicular degenerative changes are present. IMPRESSION: Nondisplaced fracture of the left humeral neck. Electronically Signed   By: Richardean Sale M.D.   On: 11/07/2015 15:47   Dg Knee Complete 4 Views Left  Result Date: 11/07/2015 CLINICAL DATA:  Witnessed fall.  Left hip and knee pain.  Dementia. EXAM: LEFT KNEE - COMPLETE 4+ VIEW COMPARISON:  None. FINDINGS: Positioning is mildly limited by the patient's left hip injury. The mineralization and alignment appear adequate. No evidence of acute fracture or dislocation. There is mild medial compartment joint space narrowing. No significant joint effusion. IMPRESSION: No acute findings demonstrated at the left knee. Mild medial compartment joint space narrowing. Electronically Signed   By: Richardean Sale M.D.   On: 11/07/2015 15:45   Dg Hip Unilat With Pelvis 2-3 Views Left  Result Date: 11/07/2015 CLINICAL DATA:  Witnessed fall.  Left hip and knee pain.  Dementia. EXAM: DG HIP (WITH OR WITHOUT PELVIS) 2-3V LEFT COMPARISON:  Right hip radiographs 01/06/2011. FINDINGS: Mineralization appears adequate for age. There is a superiorly displaced fracture of the left femoral neck. The femoral head is located. No evidence of pelvic fracture. Mild lower lumbar spondylosis noted. IMPRESSION: Moderately displaced fracture of the left femoral neck. No dislocation. Electronically Signed   By: Richardean Sale M.D.   On: 11/07/2015 15:44    Medications:  Prior to Admission:  Prescriptions Prior to Admission  Medication Sig Dispense Refill Last Dose  . aspirin 81 MG chewable tablet Chew 81 mg by mouth daily.   11/06/2015 at Unknown time  . levothyroxine (SYNTHROID, LEVOTHROID) 88 MCG tablet Take 88 mcg by mouth daily.     11/07/2015 at Unknown time  . Melatonin 3 MG TABS Take 1 tablet by mouth at bedtime.   11/06/2015 at Unknown time  . nitroGLYCERIN (NITROSTAT) 0.4 MG SL tablet Place 0.4 mg under the tongue every 5 (five) minutes as needed for chest pain.   unknown  . saccharomyces boulardii (FLORASTOR) 250 MG capsule Take 250 mg by mouth daily. For 28 days.   11/06/2015 at Unknown time  . Vitamin D, Ergocalciferol, (DRISDOL) 50000 units CAPS capsule Take 50,000 Units by mouth every 7 (seven) days.   Past Month at Unknown time   Scheduled: . [MAR Hold] sodium chloride   Intravenous Once  . [MAR Hold] aspirin  81 mg Oral Daily  . [MAR Hold] Chlorhexidine Gluconate Cloth  6 each Topical Q0600  . [MAR Hold] docusate sodium  100 mg Oral BID  . [MAR Hold] feeding supplement (ENSURE ENLIVE)  237 mL Oral BID BM  . [MAR Hold] levothyroxine  88 mcg Oral QAC breakfast  . [MAR Hold] mupirocin ointment  1 application Nasal BID  . povidone-iodine  2 application Topical Once  . [MAR Hold] saccharomyces boulardii  250 mg Oral Daily   Continuous: . lactated ringers 1,000 mL (11/09/15 0726)   PYK:DXIPJASN (SUBLIMAZE) injection, [MAR Hold] HYDROcodone-acetaminophen, midazolam, [MAR Hold]  morphine injection  Assesment: She has left hip fracture and a left humeral fracture. She has dementia and has been confused. She is set for surgery. She has moderate malnutrition. Principal Problem:   Closed displaced fracture of left femoral neck (HCC) Active Problems:   Hypothyroidism  Alzheimer's dementia without behavioral disturbance   Altered mental status   Closed fracture of head of left humerus   Hip  fracture (HCC)   Malnutrition of moderate degree    Plan: Continue treatments. For surgery today.    LOS: 2 days   Adin Laker L 11/09/2015, 7:49 AM

## 2015-11-09 NOTE — Anesthesia Procedure Notes (Signed)
Spinal  Patient location during procedure: OR Start time: 11/09/2015 7:50 AM End time: 11/09/2015 7:53 AM Staffing Resident/CRNA: Minerva AreolaYATES, Olla Delancey S Preanesthetic Checklist Completed: patient identified, site marked, surgical consent, pre-op evaluation, timeout performed, IV checked, risks and benefits discussed and monitors and equipment checked Spinal Block Patient position: left lateral decubitus Prep: Betadine Patient monitoring: heart rate, cardiac monitor, continuous pulse ox and blood pressure Approach: left paramedian Location: L3-4 Injection technique: single-shot Needle Needle type: Spinocan  Needle gauge: 22 G Needle length: 9 cm Assessment Sensory level: T6 Additional Notes Betadine prep x3 1% lidocaine skinwheal  Clear CSF pre and post injection  ATTEMPTS: 1 TRAY ID: [id number] TRAY EXPIRATION DATE: 01-05-2017 GTIN [id number]

## 2015-11-09 NOTE — Transfer of Care (Signed)
Immediate Anesthesia Transfer of Care Note  Patient: Kristin AlvineDixie M Velasquez  Procedure(s) Performed: Procedure(s) with comments: ARTHROPLASTY BIPOLAR HIP (HEMIARTHROPLASTY) (Left) - 0730  Patient Location: PACU  Anesthesia Type:Spinal  Level of Consciousness: sedated and patient cooperative  Airway & Oxygen Therapy: Patient Spontanous Breathing and non-rebreather face mask  Post-op Assessment: Report given to RN and Post -op Vital signs reviewed and stable  Post vital signs: Reviewed and stable  Last Vitals:  Vitals:   11/09/15 0715 11/09/15 0730  BP:    Pulse:    Resp: (!) 27 19  Temp:      Last Pain:  Vitals:   11/09/15 0648  TempSrc: Oral  PainSc:          Complications: No apparent anesthesia complications

## 2015-11-09 NOTE — Progress Notes (Signed)
OR up to get pt.  On call antibiotic given to OR nurse.

## 2015-11-10 LAB — BASIC METABOLIC PANEL
ANION GAP: 6 (ref 5–15)
BUN: 17 mg/dL (ref 6–20)
CO2: 26 mmol/L (ref 22–32)
Calcium: 8 mg/dL — ABNORMAL LOW (ref 8.9–10.3)
Chloride: 101 mmol/L (ref 101–111)
Creatinine, Ser: 0.69 mg/dL (ref 0.44–1.00)
GFR calc Af Amer: >60 mL/min (ref >60)
GFR calc non Af Amer: >60 mL/min (ref >60)
GLUCOSE: 122 mg/dL — AB (ref 65–99)
POTASSIUM: 4 mmol/L (ref 3.5–5.1)
Sodium: 133 mmol/L — ABNORMAL LOW (ref 135–145)

## 2015-11-10 LAB — CBC
HEMATOCRIT: 39.3 % (ref 36.0–46.0)
HEMOGLOBIN: 13.1 g/dL (ref 12.0–15.0)
MCH: 31.1 pg (ref 26.0–34.0)
MCHC: 33.3 g/dL (ref 30.0–36.0)
MCV: 93.3 fL (ref 78.0–100.0)
Platelets: 153 10*3/uL (ref 150–400)
RBC: 4.21 MIL/uL (ref 3.87–5.11)
RDW: 14.3 % (ref 11.5–15.5)
WBC: 12.2 10*3/uL — AB (ref 4.0–10.5)

## 2015-11-10 NOTE — Evaluation (Signed)
Occupational Therapy Evaluation Patient Details Name: [name] MRN: [id number] DOB: 11/28/1930 Today's Date: 11/10/2015    History of Present Illness [name] is a 82 y.o. female wit hx of Alzheimer's, depression and PVD presented today after a fall at [facility], "just fell over" per ED notes.   In ED found to have L hip fx and L humeral head fracture.  Asked to see for admit for L hip fracture. Pt's daughter and sister provide history.  Patient has dementia and was put in SNF in 2014 after her sister's husband died.  He had been taking care of her.  She had been widowed yrs before.  Over the last 3 yrs has had sig decline in activity and memory,  Over the last 6 mos particularly has had decline.  Per family her wishes are for no heroic measure/ no life support .  No hx heart disease, afib or COPD.  Pt underwent left hemiarthroplasty on 10/3, has left proximal humerus fx, wearing sling at all times.    Clinical Impression   Pt awake, alert this am, not oriented this am. OT/PT co-evaluation completed due to pt cognitive status. Pt unable to follow simple one-step commands this am due to advanced dementia. Bed mobility unable to be completed due to max guarding LLE and inability to comprehend & follow commands. Pt is currently max-total assist with ADL and mobility tasks, plan is to discharge back to SNF, [facility]. Will continue to follow while in acute care.     Follow Up Recommendations  SNF;Other (comment) (return to [facility] with rehab services)    Equipment Recommendations  None recommended by OT       Precautions / Restrictions Precautions Precautions: Other (comment);Shoulder (lateral hip) Type of Shoulder Precautions: LUE sling at all times Shoulder Interventions: Shoulder sling/immobilizer;At all times Restrictions Weight Bearing Restrictions: Yes LUE Weight Bearing: Non weight bearing RLE Weight Bearing: Weight bearing as tolerated LLE Weight Bearing:  Weight bearing as tolerated      Mobility Bed Mobility               General bed mobility comments: Unable to perform due to pain and cognitive status  Transfers                 General transfer comment: Unable to perform due to pain and cognitive status         ADL Overall ADL's : Needs assistance/impaired                                       General ADL Comments: Pt requires max-total assist for ADL tasks at this time due to cognitive impairments hindering pt's ability to comprehend situation and follow instructions. Pain limited               Pertinent Vitals/Pain Pain Assessment: Faces Faces Pain Scale: Hurts even more Pain Location: left hip and shoulder Pain Descriptors / Indicators: Guarding;Grimacing Pain Intervention(s): Limited activity within patient's tolerance;Monitored during session;Repositioned     Hand Dominance Right   Extremity/Trunk Assessment Upper Extremity Assessment Upper Extremity Assessment: LUE deficits/detail LUE Deficits / Details: LUE-proximal humerus fx, in sling at all times LUE: Unable to fully assess due to immobilization   Lower Extremity Assessment Lower Extremity Assessment: Defer to PT evaluation       Communication Communication Communication: No difficulties   Cognition Arousal/Alertness:  Awake/alert Behavior During Therapy: Anxious Overall Cognitive Status: History of cognitive impairments - at baseline                                Home Living Family/patient expects to be discharged to:: Skilled nursing facility                                        Prior Functioning/Environment Level of Independence: Needs assistance  Gait / Transfers Assistance Needed: Per chart review: pt very active at facility, unsure of DME utilized ADL's / Homemaking Assistance Needed: Pt in memory care unit, assitance provided for ADLs, unsure of prior level of assistance             OT Problem List: Decreased strength;Decreased range of motion;Decreased activity tolerance;Impaired balance (sitting and/or standing);Decreased cognition;Decreased safety awareness;Impaired UE functional use;Pain   OT Treatment/Interventions: Self-care/ADL training;Therapeutic exercise;Therapeutic activities    OT Goals(Current goals can be found in the care plan section) Acute Rehab OT Goals Patient Stated Goal: none stated  OT Frequency: Min 2X/week           Co-evaluation PT/OT/SLP Co-Evaluation/Treatment: Yes Reason for Co-Treatment: Complexity of the patient's impairments (multi-system involvement);Necessary to address cognition/behavior during functional activity PT goals addressed during session: Mobility/safety with mobility;Balance;Strengthening/ROM OT goals addressed during session: ADL's and self-care      End of Session Nurse Communication: Mobility status  Activity Tolerance: Patient limited by pain;Other (comment) (limited by cognitive status) Patient left: in bed;with call bell/phone within reach;with bed alarm set   Time: [id number] OT Time Calculation (min): 20 min Charges:  OT General Charges $OT Visit: 1 Procedure OT Evaluation $OT Eval Moderate Complexity: 1 Procedure Ezra SitesLeslie Japleen Tornow, OTR/L  [phone] 11/10/2015, 10:46 AM

## 2015-11-10 NOTE — Evaluation (Signed)
Physical Therapy Evaluation Patient Details Name: Criss AlvineDixie M Kassis MRN: [id number] DOB: 08/04/1930 Today's Date: 11/10/2015   History of Present Illness  Nailah Burnell BlanksM Chittenden is a 82 y.o. female wit hx of Alzheimer's, depression and PVD presented today after a fall [facility], "just fell over" per ED notes.   In ED found to have L hip fx and L humeral head fracture.  Asked to see for admit for L hip fracture. Pt's daughter and sister provide history.  Patient has dementia and was put in SNF in 2014 after her sister's husband died.  He had been taking care of her.  She had been widowed yrs before.  Over the last 3 yrs has had sig decline in activity and memory,  Over the last 6 mos particularly has had decline.  Per family her wishes are for no heroic measure/ no life support .  No hx heart disease, afib or COPD.  Pt underwent left hemiarthroplasty on 10/3, has left proximal humerus fx, wearing sling at all times.   Clinical Impression  Pt was found in her bed and agreeable to PT/OT evaluation. Noting pt with abduction pillow and communicating pain along her Lt UE and Lt hip. She was disoriented x4, however pleasant until therapist attempted to move her LLE. She requires MaxA with all LE movements and was able to perform several heel slides, etc with her RLE on her own with heavy cues from therapist. She is unable to follow both simple and complex commands, and after several attempts to encourage mobility to EOB, we were unsuccessful. At this time, her ability to make progress towards any established goals is significantly limited due to her mental status. She would benefit from 24 hour supervision upon return to prior living facility.    Follow Up Recommendations Supervision/Assistance - 24 hour;Other (comment) ([facility])    Teacher, adult educationquipment Recommendations       Recommendations for [employer] Other (comment) (Pt unable to follow commands, limiting her ability to make progress with  continued skilled PT at return to facility)     Precautions / Restrictions Precautions Precautions: Other (comment) (lateral hip precautions per MD ) Type of Shoulder Precautions: LUE sling at all times Shoulder Interventions: Shoulder sling/immobilizer;At all times Required Braces or Orthoses: Other Brace/Splint Other Brace/Splint: Pt wearing abduction wedge Restrictions Weight Bearing Restrictions: Yes LUE Weight Bearing: Non weight bearing RLE Weight Bearing: Weight bearing as tolerated LLE Weight Bearing: Weight bearing as tolerated      Mobility  Bed Mobility               General bed mobility comments: Unable to perform due to pain and cognitive status  Transfers                 General transfer comment: Unable to perform due to pain and cognitive status  Ambulation/Gait                Stairs            Wheelchair Mobility    Modified Rankin (Stroke Patients Only)       Balance Overall balance assessment: History of Falls                                           Pertinent Vitals/Pain Pain Assessment: Faces Faces Pain Scale: Hurts even more Pain Location: Lt hip,  Lt shoulder Pain Descriptors / Indicators: Guarding;Grimacing Pain Intervention(s): Limited activity within patient's tolerance;Repositioned;Monitored during session    Home Living Family/patient expects to be discharged to:: Skilled nursing facility                      Prior Function Level of Independence: Needs assistance   Gait / Transfers Assistance Needed: Unsure due to AMS, per chart pt active at dacility, but unsure of level of assistance needed  ADL's / Homemaking Assistance Needed: Pt in memory care unit, assitance provided for ADLs, unsure of prior level of assistance        Hand Dominance   Dominant Hand: Right    Extremity/Trunk Assessment   Upper Extremity Assessment: Defer to OT evaluation       LUE Deficits /  Details: LUE-proximal humerus fx, in sling at all times   Lower Extremity Assessment: Generalized weakness;Difficult to assess due to impaired cognition         Communication   Communication: No difficulties  Cognition Arousal/Alertness: Awake/alert Behavior During Therapy: Anxious Overall Cognitive Status: History of cognitive impairments - at baseline                      General Comments      Exercises General Exercises - Lower Extremity Ankle Circles/Pumps: Left;5 reps;PROM;Other (comment) (Unable to follow commands) Heel Slides: Strengthening;Right;Left;10 reps;Other (comment);PROM (Unable to follow commands )   Assessment/Plan    PT Assessment Patient needs continued PT services  PT Problem List Decreased strength;Decreased range of motion;Decreased activity tolerance;Decreased mobility;Decreased safety awareness;Decreased cognition;Pain          PT Treatment Interventions DME instruction;Gait training;Functional mobility training;Therapeutic activities;Patient/family education;Balance training;Therapeutic exercise;Neuromuscular re-education;Modalities;Manual techniques    PT Goals (Current goals can be found in the Care Plan section)  Acute Rehab PT Goals Patient Stated Goal: none stated    Frequency 7X/week   Barriers to discharge        Co-evaluation   Reason for Co-Treatment: Complexity of the patient's impairments (multi-system involvement);Necessary to address cognition/behavior during functional activity PT goals addressed during session: Mobility/safety with mobility;Balance;Strengthening/ROM OT goals addressed during session: ADL's and self-care       End of Session Equipment Utilized During Treatment: Other (comment) (abduction pillow returned at end of session ) Activity Tolerance: Treatment limited secondary to medical complications (Comment);Other (comment) (limited due to AMS and inability to follow commands )   Nurse Communication:  Mobility status    Functional Assessment Tool Used: Clinical judgement based on assessment of bed mobility Functional Limitation: Changing and maintaining body position Changing and Maintaining Body Position Current Status (W0981): At least 80 percent but less than 100 percent impaired, limited or restricted Changing and Maintaining Body Position Goal Status (X9147): At least 80 percent but less than 100 percent impaired, limited or restricted    Time: 0828-0848 PT Time Calculation (min) (ACUTE ONLY): 20 min   Charges:   PT Evaluation $PT Eval Moderate Complexity: 1 Procedure PT Treatments $Therapeutic Exercise: 8-22 mins   PT G Codes:   PT G-Codes **NOT FOR INPATIENT CLASS** Functional Assessment Tool Used: Clinical judgement based on assessment of bed mobility Functional Limitation: Changing and maintaining body position Changing and Maintaining Body Position Current Status (W2956): At least 80 percent but less than 100 percent impaired, limited or restricted Changing and Maintaining Body Position Goal Status (O1308): At least 80 percent but less than 100 percent impaired, limited or restricted   12:29 PM,11/10/15  Marylyn Ishihara PT, DPT [facility] [phone]

## 2015-11-10 NOTE — Progress Notes (Signed)
Patient ID: [name], female   DOB: 04/05/1930, 82 y.o.   MRN: [id number] BP 116/73 (BP Location: Right Arm)   Pulse 84   Temp 98.9 F (37.2 C) (Oral)   Resp 20   Ht 5\' 4"  (1.626 m)   Wt 142 lb 8 oz (64.6 kg)   SpO2 96%   BMI 24.46 kg/m   POD 1   LEFT HIP BIPOLAR   CBC Latest Ref Rng & Units 11/07/2015 06/17/2012 06/16/2012  WBC 4.0 - 10.5 K/uL 12.4(H) 6.3 9.0  Hemoglobin 12.0 - 15.0 g/dL 40.914.8 81.113.7 91.415.0  Hematocrit 36.0 - 46.0 % 44.4 39.5 43.1  Platelets 150 - 400 K/uL 162 198 206   BMP Latest Ref Rng & Units 11/07/2015 06/17/2012 06/16/2012  Glucose 65 - 99 mg/dL 782(N148(H) 95 562(Z114(H)  BUN 6 - 20 mg/dL 16 16 20   Creatinine 0.44 - 1.00 mg/dL 3.080.81 6.570.90 8.461.01  Sodium 135 - 145 mmol/L 137 138 136  Potassium 3.5 - 5.1 mmol/L 3.1(L) 3.2(L) 3.1(L)  Chloride 101 - 111 mmol/L 102 99 96  CO2 22 - 32 mmol/L 29 29 27   Calcium 8.9 - 10.3 mg/dL 9.6(E8.5(L) 8.4 9.2   START PT

## 2015-11-10 NOTE — Addendum Note (Signed)
Addendum  created 11/10/15 0744 by Earleen NewportAmy A Cosmo Tetreault, CRNA   Sign clinical note

## 2015-11-10 NOTE — Care Management Important Message (Signed)
Important Message  Patient Details  Name: Criss AlvineDixie M Hazelip MRN: [id number] Date of Birth: 07/10/1930   Medicare Important Message Given:  Yes    Malcolm Metrohildress, Anjela Cassara Demske, RN 11/10/2015, 3:18 PM

## 2015-11-10 NOTE — Anesthesia Postprocedure Evaluation (Signed)
Anesthesia Post Note  Patient: Criss AlvineDixie M Fillingim  Procedure(s) Performed: Procedure(s) (LRB): ARTHROPLASTY BIPOLAR HIP (HEMIARTHROPLASTY) (Left)  Patient location during evaluation: Nursing Unit Anesthesia Type: Spinal Level of consciousness: awake and alert (patient with alzheimers;at baseline) Pain management: pain level controlled Vital Signs Assessment: post-procedure vital signs reviewed and stable Respiratory status: spontaneous breathing and patient connected to nasal cannula oxygen Cardiovascular status: stable Postop Assessment: no signs of nausea or vomiting, no backache and no headache Anesthetic complications: no    Last Vitals:  Vitals:   11/10/15 0200 11/10/15 0612  BP: (!) 145/71 116/73  Pulse: 86 84  Resp: 18 20  Temp: 37.3 C 37.2 C    Last Pain:  Vitals:   11/10/15 0612  TempSrc: Oral  PainSc:                  ADAMS, AMY A

## 2015-11-11 LAB — BASIC METABOLIC PANEL
Anion gap: 3 — ABNORMAL LOW (ref 5–15)
BUN: 22 mg/dL — ABNORMAL HIGH (ref 6–20)
CALCIUM: 8 mg/dL — AB (ref 8.9–10.3)
CO2: 30 mmol/L (ref 22–32)
CREATININE: 0.78 mg/dL (ref 0.44–1.00)
Chloride: 99 mmol/L — ABNORMAL LOW (ref 101–111)
GFR calc Af Amer: >60 mL/min (ref >60)
GFR calc non Af Amer: >60 mL/min (ref >60)
GLUCOSE: 125 mg/dL — AB (ref 65–99)
Potassium: 3.9 mmol/L (ref 3.5–5.1)
Sodium: 132 mmol/L — ABNORMAL LOW (ref 135–145)

## 2015-11-11 LAB — CBC
HEMATOCRIT: 38.1 % (ref 36.0–46.0)
Hemoglobin: 12.3 g/dL (ref 12.0–15.0)
MCH: 30.6 pg (ref 26.0–34.0)
MCHC: 32.3 g/dL (ref 30.0–36.0)
MCV: 94.8 fL (ref 78.0–100.0)
Platelets: 154 10*3/uL (ref 150–400)
RBC: 4.02 MIL/uL (ref 3.87–5.11)
RDW: 14.3 % (ref 11.5–15.5)
WBC: 12.1 10*3/uL — ABNORMAL HIGH (ref 4.0–10.5)

## 2015-11-11 MED ORDER — DOCUSATE SODIUM 50 MG/5ML PO LIQD
100.0000 mg | Freq: Two times a day (BID) | ORAL | Status: DC
  Filled 2015-11-11 (×3): qty 10

## 2015-11-11 MED ORDER — DOCUSATE SODIUM 100 MG PO CAPS
100.0000 mg | ORAL_CAPSULE | Freq: Two times a day (BID) | ORAL | Status: DC
  Administered 2015-11-11 – 2015-11-12 (×3): 100 mg via ORAL
  Filled 2015-11-11 (×3): qty 1

## 2015-11-11 NOTE — Progress Notes (Signed)
OT Cancellation Note  Patient Details Name: Kristin AlvineDixie M Velasquez MRN: [id number] DOB: 06/06/1930   Cancelled Treatment:     Reason evaluation not completed: Attempted to see pt for OT treatment session this am. Pt very tearful and emotionally labile this am, was able to follow one-step command to assist OT in fixing gown.  Pt becoming increasingly tearful and agitated with additional attempts at mobility tasks. Pt severely limited in participation due to advanced dementia, continue to recommend return to SNF with 24/7 supervision at max-total assist for functional tasks.    Ezra SitesLeslie Troxler, OTR/L  [phone] 11/11/2015, 10:25 AM

## 2015-11-11 NOTE — Clinical Social Work Note (Signed)
CSW spoke with patient's granddaughter, Alcario Droughtrica, who advised that she wanted patient to [facility].

## 2015-11-11 NOTE — Progress Notes (Signed)
PT Cancellation Note  Patient Details Name: [name] MRN: [id number] DOB: Nov 01, 1930   Cancelled Treatment:    Reason Eval/Treat Not Completed: Patient's level of consciousness;Other (comment). Chart reviewed, RN consulted. PT entered room, patient appears pleasant and somewhat hyperactive, producing a continuous stream of phonation that alternates English, babbling, and other types of sounds: the parts that are understandable are follow no discernable line or reasoning or thought process. The patient listens intermittently acknowledging that I am speaking to her, but responds in ways inappropriate to situation. The patient appears comfortable, but is constantly moving, writhing, trying to undress herself, redress herself, and/or reposition her arm in sling. All VC and tactile cues attempted to engage patient in exercises or ROM in bed are met with resistance, guarding, and concerns over surprising hip pain with movement. Son in room, is able to confirm that although her mentation is somewhat variable, her current presentation is consistent with what is typical for her. Son and PT discussed the need for her to get out of bed eventually and into the chair for best preservation of function/health, but that her disorientation to situation and post-surgical precautions may create additional safety concerns. PT to attempt treatment again at later date/time as appropriate.    2:03 PM, 11/11/15 Etta Grandchild, PT, DPT Physical Therapist - [place] [phone] (mobile)

## 2015-11-12 ENCOUNTER — Encounter (HOSPITAL_COMMUNITY): Payer: Self-pay | Admitting: Orthopedic Surgery

## 2015-11-12 LAB — CBC
HCT: 37.9 % (ref 36.0–46.0)
Hemoglobin: 12.5 g/dL (ref 12.0–15.0)
MCH: 31 pg (ref 26.0–34.0)
MCHC: 33 g/dL (ref 30.0–36.0)
MCV: 94 fL (ref 78.0–100.0)
PLATELETS: 215 10*3/uL (ref 150–400)
RBC: 4.03 MIL/uL (ref 3.87–5.11)
RDW: 14.2 % (ref 11.5–15.5)
WBC: 12 10*3/uL — AB (ref 4.0–10.5)

## 2015-11-12 LAB — TYPE AND SCREEN
ABO/RH(D): A POS
ANTIBODY SCREEN: NEGATIVE
Unit division: 0
Unit division: 0

## 2015-11-12 NOTE — Care Management Important Message (Signed)
Important Message  Patient Details  Name: [name] MRN: [id number] Date of Birth: 06/11/1930   Medicare Important Message Given:  Yes    [name], Chrystine OilerSharley Diane, RN 11/12/2015, 1:46 PM

## 2015-11-12 NOTE — Clinical Social Work Note (Signed)
Per MD, anticipate d/c tomorrow. CSW [facility]'s son and [facility]. Facility aware pt is not working with PT/OT and may be nursing level of care.   Derenda FennelKara Claudean Leavelle, LCSW [phone]

## 2015-11-12 NOTE — NC FL2 (Signed)
[place] MEDICAID FL2 LEVEL OF CARE SCREENING TOOL     IDENTIFICATION  Patient Name: [name] Birthdate: 05/12/1930 Sex: female Admission Date (Current Location): 11/07/2015  [facility] and [place] Number:  [employer] and Address:  [facility],  [address], [place] [address]      Provider Number: [id number]  Attending Physician Name and Address:  Vickki Hearing, MD  Relative Name and Phone Number:       Current Level of Care: Hospital Recommended Level of Care: Nursing Facility Prior Approval Number:    Date Approved/Denied:   PASRR Number: [id number]  Discharge Plan: SNF    Current Diagnoses: Patient Active Problem List   Diagnosis Date Noted  . Malnutrition of moderate degree 11/09/2015  . Closed displaced fracture of left femoral neck (HCC) 11/07/2015  . Closed fracture of head of left humerus 11/07/2015  . Hip fracture (HCC) 11/07/2015  . Alzheimer's dementia 06/18/2012  . Altered mental status 06/16/2012  . Alzheimer's dementia without behavioral disturbance 10/15/2011  . Complicated UTI (urinary tract infection) 10/09/2011  . Acute renal failure (HCC) 10/09/2011  . GERD (gastroesophageal reflux disease) 10/09/2011  . Hypothyroidism 10/09/2011    Orientation RESPIRATION BLADDER Height & Weight     Self  Normal Incontinent Weight: 142 lb 8 oz (64.6 kg) Height:  5\' 4"  (162.6 cm)  BEHAVIORAL SYMPTOMS/MOOD NEUROLOGICAL BOWEL NUTRITION STATUS   (none)  (n/a) Incontinent Diet (Regular)  AMBULATORY STATUS COMMUNICATION OF NEEDS Skin   Total Care Verbally Surgical wounds                       Personal Care Assistance Level of Assistance  Total care Bathing Assistance: Maximum assistance Feeding assistance: Maximum assistance Dressing Assistance: Maximum assistance     Functional Limitations Info  Sight, Hearing, Speech Sight Info: Adequate Hearing Info: Adequate Speech Info: Adequate    SPECIAL CARE FACTORS  FREQUENCY  OT (By licensed OT)  PT (by licensed PT)                    Contractures Contractures Info: Not present    Additional Factors Info  Isolation Precautions Code Status Info: DNR Allergies Info: Penicillins     Isolation Precautions Info: 11/08/15 Surgical PCR Screen = Positive for MRSA (cj)     Current Medications (11/12/2015):  This is the current hospital active medication list Current Facility-Administered Medications  Medication Dose Route Frequency Provider Last Rate Last Dose  . acetaminophen (TYLENOL) tablet 650 mg  650 mg Oral Q6H PRN Vickki Hearing, MD   650 mg at 11/09/15 1652   Or  . acetaminophen (TYLENOL) suppository 650 mg  650 mg Rectal Q6H PRN Vickki Hearing, MD      . aspirin EC tablet 325 mg  325 mg Oral Q breakfast Vickki Hearing, MD   325 mg at 11/11/15 5621  . Chlorhexidine Gluconate Cloth 2 % PADS 6 each  6 each Topical Q0600 Vickki Hearing, MD   6 each at 11/12/15 [phone]  . docusate sodium (COLACE) capsule 100 mg  100 mg Oral BID Vickki Hearing, MD   100 mg at 11/11/15 2136  . feeding supplement (ENSURE ENLIVE) (ENSURE ENLIVE) liquid 237 mL  237 mL Oral BID BM Kari Baars, MD   237 mL at 11/11/15 1400  . HYDROcodone-acetaminophen (NORCO/VICODIN) 5-325 MG per tablet 1-2 tablet  1-2 tablet Oral Q6H PRN Delano Metz, MD   1  tablet at 11/08/15 1155  . HYDROcodone-acetaminophen (NORCO/VICODIN) 5-325 MG per tablet 1-2 tablet  1-2 tablet Oral Q6H PRN Vickki HearingStanley E Harrison, MD   2 tablet at 11/11/15 2136  . levothyroxine (SYNTHROID, LEVOTHROID) tablet 88 mcg  88 mcg Oral QAC breakfast Delano Metzobert Schertz, MD   88 mcg at 11/11/15 [id number]  . menthol-cetylpyridinium (CEPACOL) lozenge 3 mg  1 lozenge Oral PRN Vickki HearingStanley E Harrison, MD       Or  . phenol (CHLORASEPTIC) mouth spray 1 spray  1 spray Mouth/Throat PRN Vickki HearingStanley E Harrison, MD      . metoCLOPramide (REGLAN) tablet 5-10 mg  5-10 mg Oral Q8H PRN Vickki HearingStanley E Harrison, MD       Or  .  metoCLOPramide (REGLAN) injection 5-10 mg  5-10 mg Intravenous Q8H PRN Vickki HearingStanley E Harrison, MD      . morphine 2 MG/ML injection 0.5 mg  0.5 mg Intravenous Q2H PRN Delano Metzobert Schertz, MD   0.5 mg at 11/12/15 0415  . morphine 2 MG/ML injection 0.5 mg  0.5 mg Intravenous Q2H PRN Vickki HearingStanley E Harrison, MD      . mupirocin ointment (BACTROBAN) 2 % 1 application  1 application Nasal BID Vickki HearingStanley E Harrison, MD   1 application at 11/11/15 2137  . ondansetron (ZOFRAN) tablet 4 mg  4 mg Oral Q6H PRN Vickki HearingStanley E Harrison, MD       Or  . ondansetron [facility](ZOFRAN) injection 4 mg  4 mg Intravenous Q6H PRN Vickki HearingStanley E Harrison, MD      . saccharomyces boulardii [facility](FLORASTOR) capsule 250 mg  250 mg Oral Daily Delano Metzobert Schertz, MD   250 mg at 11/11/15 [id number]     Discharge Medications: Please see discharge summary for a list of discharge medications.  Relevant Imaging Results:  Relevant Lab Results:   Additional Information    Karn CassisStultz, Deshawna Mcneece [place], [place] [phone]

## 2015-11-12 NOTE — Progress Notes (Addendum)
PT Cancellation Note  Patient Details Name: Kristin AlvineDixie M Vanbeek MRN: [id number] DOB: 08/15/1930   Cancelled Treatment:    Reason Eval/Treat Not Completed: Patient's level of consciousness;Other (comment) Chart reviewed, treatment attempted. Pt remains very confused and not following simple cues for participation; severe language deficits noted. She is currently trying to slide out of the bed on R side, breaking 'leg crossing' precautions. PT able to assist with AAROM heel slides (range 15-85 degrees hip flexion on L, without exacerbation of pain, strength), but patient strongly resisting all other direction for exercises or repositioning. PT assists NA with personal hygiene, bed changes, and repositioned with abduction wedge for safety. Pt has frequent and continuous use of BUE including the LUE which is currently broken. Of note: strength assessment in resisting placement of wedge pillow demonstrates high levels of hip strength into adduction/internal rotation. Son reports pt spent about 2 hours recumbent in bed today. Pt continues to demonstrate poor capacity to participate in PT due to moderate to advanced dementia. I will complete the order. No additional PT warranted at his point in time. Recommending basic mobility in bed and supervised transfers to chair with nursing.    4:31 PM, 11/12/15 Rosamaria LintsAllan C Buccola, PT, DPT Physical Therapist - [place] [phone](ASCOM)  [phone] (mobile)

## 2015-11-13 MED ORDER — HYDROCODONE-ACETAMINOPHEN 5-325 MG PO TABS: 1.0000 | ORAL_TABLET | Freq: Four times a day (QID) | ORAL | 0 refills | Status: AC | PRN

## 2015-11-13 MED ORDER — ENSURE ENLIVE PO LIQD: 237.0000 mL | Freq: Two times a day (BID) | ORAL | 12 refills | Status: DC

## 2015-11-13 NOTE — Progress Notes (Signed)
She is apparently back to baseline. She's not been very cooperative with physical therapy but she has pretty severe dementia. She is ready for transfer back to the skilled care facility.  Exam shows she is confused. Her chest is clear. Heart is regular. Surgical site looks okay.  She is at maximum medical benefit from this hospitalization and ready for transfer back to skilled care facility

## 2015-11-13 NOTE — Progress Notes (Signed)
2:26 PM       Attempted to call report to [name].  I spoke with the [name] the nurse that was going to take report.  She stated that she did not know for sure that the patient was coming back today and would have to have a copy of the d/c summary and FL2 before the patient can return to the facility.  I verbalized understanding and faxed the request copies to [phone] the fax number she gave me.  She stated that she would call me back after she reviewed the information

## 2015-11-13 NOTE — Discharge Summary (Signed)
Physician Discharge Summary  Patient ID: [name] MRN: [id number] DOB/AGE: [birth year] 82 y.o. Primary Care Physician:Demiyah Fischbach L, MD Admit date: 11/07/2015 Discharge date: 11/13/2015    Discharge Diagnoses:   Principal Problem:   Closed displaced fracture of left femoral neck (HCC) Active Problems:   Hypothyroidism   Alzheimer's dementia without behavioral disturbance   Altered mental status   Closed fracture of head of left humerus   Hip fracture (HCC)   Malnutrition of moderate degree     Medication List    TAKE these medications   aspirin 81 MG chewable tablet Chew 81 mg by mouth daily.   feeding supplement (ENSURE ENLIVE) Liqd Take 237 mLs by mouth 2 (two) times daily between meals.   HYDROcodone-acetaminophen 5-325 MG tablet Commonly known as:  NORCO/VICODIN Take 1-2 tablets by mouth every 6 (six) hours as needed for moderate pain.   levothyroxine 88 MCG tablet Commonly known as:  SYNTHROID, LEVOTHROID Take 88 mcg by mouth daily.   Melatonin 3 MG Tabs Take 1 tablet by mouth at bedtime.   nitroGLYCERIN 0.4 MG SL tablet Commonly known as:  NITROSTAT Place 0.4 mg under the tongue every 5 (five) minutes as needed for chest pain.   saccharomyces boulardii 250 MG capsule Commonly known as:  FLORASTOR Take 250 mg by mouth daily. For 28 days.   Vitamin D (Ergocalciferol) 50000 units Caps capsule Commonly known as:  DRISDOL Take 50,000 Units by mouth every 7 (seven) days.       Discharged Condition:Improved    Consults: Orthopedics  Significant Diagnostic Studies: Dg Chest 1 View  Result Date: 11/07/2015 CLINICAL DATA:  Dementia.  Witnessed fall. EXAM: CHEST 1 VIEW COMPARISON:  CT 03/11/2015 FINDINGS: Normal cardiac silhouette. Lung bases are difficult to evaluate. No effusion, infiltrate pneumothorax. Chronic bronchitic markings. IMPRESSION: Chronic bronchitic markings.  No acute findings. Electronically Signed   By: Genevive Bi M.D.   On:  11/07/2015 15:52   Dg Forearm Left  Result Date: 11/07/2015 CLINICAL DATA:  Witnessed fall.  Dementia. EXAM: LEFT FOREARM - 2 VIEW COMPARISON:  Left hand radiographs 03/29/2007. FINDINGS: The bones appear mildly demineralized. No evidence of acute fracture or dislocation. No evidence of elbow joint effusion. There is an IV in the antecubital fossa. There are intercarpal degenerative changes which appear mildly progressive from prior radiographs. IMPRESSION: No acute osseous findings seen within the left forearm. Electronically Signed   By: Carey Bullocks M.D.   On: 11/07/2015 15:49   Dg Pelvis Portable  Result Date: 11/09/2015 CLINICAL DATA:  Left hip arthroplasty EXAM: PORTABLE PELVIS 1-2 VIEWS COMPARISON:  11/07/2015 FINDINGS: Left hip hemi arthroplasty has been placed. Anatomic alignment. No breakage for loosening of the hardware. Osteopenia. Right hip joint space is well maintained. IMPRESSION: Left hip hemi arthroplasty anatomically aligned. Electronically Signed   By: Jolaine Click M.D.   On: 11/09/2015 09:58   Dg Shoulder Left  Result Date: 11/07/2015 CLINICAL DATA:  Left hip and shoulder pain after falling.  Dementia. EXAM: LEFT SHOULDER - 2+ VIEW COMPARISON:  Limited comparison made with chest CT 03/11/2015. FINDINGS: AP and Y-views are submitted. There is a nondisplaced fracture of the left humeral neck which is only seen on the AP view. Small ossific density along the superior glenoid appears nonacute. There is no evidence of dislocation. Mild acromioclavicular degenerative changes are present. IMPRESSION: Nondisplaced fracture of the left humeral neck. Electronically Signed   By: Carey Bullocks M.D.   On: 11/07/2015 15:47   Dg Knee Complete  4 Views Left  Result Date: 11/07/2015 CLINICAL DATA:  Witnessed fall.  Left hip and knee pain.  Dementia. EXAM: LEFT KNEE - COMPLETE 4+ VIEW COMPARISON:  None. FINDINGS: Positioning is mildly limited by the patient's left hip injury. The  mineralization and alignment appear adequate. No evidence of acute fracture or dislocation. There is mild medial compartment joint space narrowing. No significant joint effusion. IMPRESSION: No acute findings demonstrated at the left knee. Mild medial compartment joint space narrowing. Electronically Signed   By: Carey Bullocks M.D.   On: 11/07/2015 15:45   Dg Hip Unilat With Pelvis 2-3 Views Left  Result Date: 11/07/2015 CLINICAL DATA:  Witnessed fall.  Left hip and knee pain.  Dementia. EXAM: DG HIP (WITH OR WITHOUT PELVIS) 2-3V LEFT COMPARISON:  Right hip radiographs 01/06/2011. FINDINGS: Mineralization appears adequate for age. There is a superiorly displaced fracture of the left femoral neck. The femoral head is located. No evidence of pelvic fracture. Mild lower lumbar spondylosis noted. IMPRESSION: Moderately displaced fracture of the left femoral neck. No dislocation. Electronically Signed   By: Carey Bullocks M.D.   On: 11/07/2015 15:44    Lab Results: Basic Metabolic Panel:  Recent Labs  16/10/96 0624  NA 132*  K 3.9  CL 99*  CO2 30  GLUCOSE 125*  BUN 22*  CREATININE 0.78  CALCIUM 8.0*   Liver Function Tests: No results for input(s): AST, ALT, ALKPHOS, BILITOT, PROT, ALBUMIN in the last 72 hours.   CBC:  Recent Labs  11/11/15 0624 11/12/15 0636  WBC 12.1* 12.0*  HGB 12.3 12.5  HCT 38.1 37.9  MCV 94.8 94.0  PLT 154 215    Recent Results (from the past 240 hour(s))  Surgical PCR screen     Status: Abnormal   Collection Time: 11/08/15  8:37 AM  Result Value Ref Range Status   MRSA, PCR POSITIVE (A) NEGATIVE Final    Comment: RESULT CALLED TO, READ BACK BY AND VERIFIED WITH: ROSE C ON 11/09/15 AT 0110 BY HAMLETT P    Staphylococcus aureus POSITIVE (A) NEGATIVE Final    Comment:        The Xpert SA Assay (FDA approved for NASAL specimens in patients over 44 years of age), is one component of a comprehensive surveillance program.  Test performance has been  validated by [facility] for patients greater than or equal to 44 year old. It is not intended to diagnose infection nor to guide or monitor treatment.      Hospital Course: This is an 82 year old who lives at a memory care unit and who fell. She was found to have fracture of her left hip and left humerus. She underwent bipolar hip hemiarthroplasty on the left. She was treated with physical therapy will was not very cooperative because of her baseline dementia. By the time of discharge she appeared to be back to baseline and is ready for transfer back to the skilled care facility for physical therapy occupational and speech therapy as needed.  Discharge Exam: Blood pressure 132/70, pulse 86, temperature 98.3 F (36.8 C), temperature source Oral, resp. rate 20, height 5\' 4"  (1.626 m), weight 64.6 kg (142 lb 8 oz), SpO2 95 %. She is confused. Her wound looks okay. She has a sling on her left arm. Her chest is clear  Disposition: To skilled care facility. She will resume her previous medications and pain medication available. PTOT and speech as needed.  Discharge Instructions    Discharge to SNF when bed  available    Complete by:  As directed         Signed: Ikenna Ohms L   11/13/2015, 8:02 AM

## 2015-11-13 NOTE — Progress Notes (Signed)
[facility].  All questions answered to satisfactory AEB no further questions asked at the end of report.  The patients son was notified of the transfer.  Patient was transferred via EMS [facility].  She left with all appropriate documentation.

## 2015-11-15 ENCOUNTER — Telehealth: Payer: Self-pay | Admitting: Orthopedic Surgery

## 2015-11-15 NOTE — Telephone Encounter (Signed)
[facility], contact Charlena CrossStephanie Adkins, Ph# [phone] regarding hospital follow up appointment, which has been scheduled for 11/23/15; surgery was left bipolar hip procedure, performed on 11/09/15.  Please advise of any post op instructions or orders; Judeth CornfieldStephanie assumes sutures to be removed at time of appointment (14-days from date of surgery).

## 2015-11-15 NOTE — Telephone Encounter (Signed)
THAT IS CORRECT

## 2015-11-15 NOTE — Telephone Encounter (Signed)
Not[facility] - spoke with Fannie KneeSue; left message for MifflinStephanie and for Esperanza Heirarol Beck, appointment scheduler.

## 2015-11-23 ENCOUNTER — Encounter: Payer: Self-pay | Admitting: Orthopedic Surgery

## 2015-11-23 ENCOUNTER — Ambulatory Visit (INDEPENDENT_AMBULATORY_CARE_PROVIDER_SITE_OTHER): Payer: Medicare Other | Admitting: Orthopedic Surgery

## 2015-11-23 DIAGNOSIS — S42295D Other nondisplaced fracture of upper end of left humerus, subsequent encounter for fracture with routine healing: Secondary | ICD-10-CM

## 2015-11-23 DIAGNOSIS — S72002D Fracture of unspecified part of neck of left femur, subsequent encounter for closed fracture with routine healing: Secondary | ICD-10-CM

## 2015-11-23 DIAGNOSIS — Z4889 Encounter for other specified surgical aftercare: Secondary | ICD-10-CM

## 2015-11-23 NOTE — Patient Instructions (Signed)
REMOVE STAPLES   XR LEFT SHOULDER SEND TO DR Romeo AppleHARRISON

## 2015-11-23 NOTE — Progress Notes (Signed)
Patient ID: Criss AlvineDixie M Velasquez, female   DOB: 02/07/1930, 82 y.o.   MRN: [id number]  Post op visit   Chief Complaint  Patient presents with  . Follow-up    POST OP LEFT BIPOLAR HIP, DOS 11/09/15    Pod 14 SUMMIT BASIC PF STEM  LEFT Proximal humerus fracture treated with sling  Hip wound is clean dry and intact. Staples are still in.  Staples can be removed.  The patient can weight-bear as tolerated  X-ray needed left shoulder  F/U 2 MONTHS

## 2015-12-25 ENCOUNTER — Encounter (HOSPITAL_COMMUNITY): Payer: Self-pay | Admitting: Emergency Medicine

## 2015-12-25 ENCOUNTER — Inpatient Hospital Stay (HOSPITAL_COMMUNITY)
Admission: EM | Admit: 2015-12-25 | Discharge: 2015-12-28 | DRG: 394 | Disposition: A | Discharge status: Skilled Nursing Facility | Payer: Medicare Other | Attending: Pulmonary Disease | Admitting: Pulmonary Disease

## 2015-12-25 DIAGNOSIS — K922 Gastrointestinal hemorrhage, unspecified: Secondary | ICD-10-CM | POA: Diagnosis present

## 2015-12-25 DIAGNOSIS — F329 Major depressive disorder, single episode, unspecified: Secondary | ICD-10-CM | POA: Diagnosis present

## 2015-12-25 DIAGNOSIS — I452 Bifascicular block: Secondary | ICD-10-CM | POA: Diagnosis not present

## 2015-12-25 DIAGNOSIS — R000 Tachycardia, unspecified: Secondary | ICD-10-CM | POA: Diagnosis not present

## 2015-12-25 DIAGNOSIS — E039 Hypothyroidism, unspecified: Secondary | ICD-10-CM | POA: Diagnosis present

## 2015-12-25 DIAGNOSIS — I739 Peripheral vascular disease, unspecified: Secondary | ICD-10-CM | POA: Diagnosis not present

## 2015-12-25 DIAGNOSIS — K219 Gastro-esophageal reflux disease without esophagitis: Secondary | ICD-10-CM | POA: Diagnosis not present

## 2015-12-25 DIAGNOSIS — D62 Acute posthemorrhagic anemia: Secondary | ICD-10-CM | POA: Diagnosis present

## 2015-12-25 DIAGNOSIS — Z96642 Presence of left artificial hip joint: Secondary | ICD-10-CM | POA: Diagnosis present

## 2015-12-25 DIAGNOSIS — E860 Dehydration: Secondary | ICD-10-CM | POA: Diagnosis present

## 2015-12-25 DIAGNOSIS — K625 Hemorrhage of anus and rectum: Secondary | ICD-10-CM | POA: Diagnosis present

## 2015-12-25 DIAGNOSIS — K648 Other hemorrhoids: Secondary | ICD-10-CM | POA: Diagnosis not present

## 2015-12-25 DIAGNOSIS — Z7982 Long term (current) use of aspirin: Secondary | ICD-10-CM

## 2015-12-25 DIAGNOSIS — R1310 Dysphagia, unspecified: Secondary | ICD-10-CM | POA: Diagnosis present

## 2015-12-25 DIAGNOSIS — G309 Alzheimer's disease, unspecified: Secondary | ICD-10-CM | POA: Diagnosis not present

## 2015-12-25 DIAGNOSIS — Z79899 Other long term (current) drug therapy: Secondary | ICD-10-CM

## 2015-12-25 DIAGNOSIS — F0280 Dementia in other diseases classified elsewhere without behavioral disturbance: Secondary | ICD-10-CM | POA: Diagnosis present

## 2015-12-25 DIAGNOSIS — Z880 Allergy status to penicillin: Secondary | ICD-10-CM

## 2015-12-25 DIAGNOSIS — K633 Ulcer of intestine: Secondary | ICD-10-CM | POA: Diagnosis not present

## 2015-12-25 DIAGNOSIS — N390 Urinary tract infection, site not specified: Secondary | ICD-10-CM | POA: Diagnosis not present

## 2015-12-25 DIAGNOSIS — K644 Residual hemorrhoidal skin tags: Secondary | ICD-10-CM | POA: Diagnosis present

## 2015-12-25 DIAGNOSIS — Z66 Do not resuscitate: Secondary | ICD-10-CM | POA: Diagnosis not present

## 2015-12-25 DIAGNOSIS — G308 Other Alzheimer's disease: Secondary | ICD-10-CM

## 2015-12-25 HISTORY — DX: Cognitive communication deficit: R41.841

## 2015-12-25 HISTORY — DX: Gastro-esophageal reflux disease without esophagitis: K21.9

## 2015-12-25 HISTORY — DX: Urinary tract infection, site not specified: N39.0

## 2015-12-25 HISTORY — DX: Other injury of unspecified body region, initial encounter: T14.8XXA

## 2015-12-25 HISTORY — DX: Repeated falls: R29.6

## 2015-12-25 HISTORY — DX: Peripheral vascular disease, unspecified: I73.9

## 2015-12-25 HISTORY — DX: Unspecified lack of coordination: R27.9

## 2015-12-25 LAB — CBC WITH DIFFERENTIAL/PLATELET
BASOS ABS: 0 10*3/uL (ref 0.0–0.1)
Basophils Relative: 0 %
EOS PCT: 1 %
Eosinophils Absolute: 0.2 10*3/uL (ref 0.0–0.7)
HEMATOCRIT: 43.8 % (ref 36.0–46.0)
HEMOGLOBIN: 14.8 g/dL (ref 12.0–15.0)
LYMPHS PCT: 12 %
Lymphs Abs: 1.7 10*3/uL (ref 0.7–4.0)
MCH: 31.4 pg (ref 26.0–34.0)
MCHC: 33.8 g/dL (ref 30.0–36.0)
MCV: 93 fL (ref 78.0–100.0)
Monocytes Absolute: 1.1 10*3/uL — ABNORMAL HIGH (ref 0.1–1.0)
Monocytes Relative: 8 %
NEUTROS ABS: 11.2 10*3/uL — AB (ref 1.7–7.7)
NEUTROS PCT: 79 %
PLATELETS: 351 10*3/uL (ref 150–400)
RBC: 4.71 MIL/uL (ref 3.87–5.11)
RDW: 14.3 % (ref 11.5–15.5)
WBC: 14.2 10*3/uL — AB (ref 4.0–10.5)

## 2015-12-25 LAB — CBC
HEMATOCRIT: 39.4 % (ref 36.0–46.0)
HEMOGLOBIN: 12.9 g/dL (ref 12.0–15.0)
MCH: 30.4 pg (ref 26.0–34.0)
MCHC: 32.7 g/dL (ref 30.0–36.0)
MCV: 92.9 fL (ref 78.0–100.0)
Platelets: 341 10*3/uL (ref 150–400)
RBC: 4.24 MIL/uL (ref 3.87–5.11)
RDW: 14.4 % (ref 11.5–15.5)
WBC: 14.2 10*3/uL — AB (ref 4.0–10.5)

## 2015-12-25 LAB — COMPREHENSIVE METABOLIC PANEL
ALBUMIN: 3.1 g/dL — AB (ref 3.5–5.0)
ALT: 52 U/L (ref 14–54)
ANION GAP: 8 (ref 5–15)
AST: 55 U/L — ABNORMAL HIGH (ref 15–41)
Alkaline Phosphatase: 88 U/L (ref 38–126)
BUN: 20 mg/dL (ref 6–20)
CHLORIDE: 104 mmol/L (ref 101–111)
CO2: 24 mmol/L (ref 22–32)
Calcium: 8.7 mg/dL — ABNORMAL LOW (ref 8.9–10.3)
Creatinine, Ser: 0.75 mg/dL (ref 0.44–1.00)
GFR calc non Af Amer: >60 mL/min (ref >60)
Glucose, Bld: 140 mg/dL — ABNORMAL HIGH (ref 65–99)
Potassium: 3.9 mmol/L (ref 3.5–5.1)
SODIUM: 136 mmol/L (ref 135–145)
Total Bilirubin: 0.6 mg/dL (ref 0.3–1.2)
Total Protein: 8.1 g/dL (ref 6.5–8.1)

## 2015-12-25 LAB — URINALYSIS, ROUTINE W REFLEX MICROSCOPIC
Bilirubin Urine: NEGATIVE
GLUCOSE, UA: NEGATIVE mg/dL
Ketones, ur: NEGATIVE mg/dL
Nitrite: NEGATIVE
PH: 6 (ref 5.0–8.0)
PROTEIN: NEGATIVE mg/dL
SPECIFIC GRAVITY, URINE: 1.02 (ref 1.005–1.030)

## 2015-12-25 LAB — URINE MICROSCOPIC-ADD ON

## 2015-12-25 LAB — MRSA PCR SCREENING: MRSA by PCR: POSITIVE — AB

## 2015-12-25 LAB — TROPONIN I

## 2015-12-25 LAB — LIPASE, BLOOD: Lipase: 26 U/L (ref 11–51)

## 2015-12-25 MED ORDER — SODIUM CHLORIDE 0.9 % IV SOLN
INTRAVENOUS | Status: AC
  Administered 2015-12-25: 20:00:00 via INTRAVENOUS

## 2015-12-25 MED ORDER — LEVOTHYROXINE SODIUM 88 MCG PO TABS
88.0000 ug | ORAL_TABLET | Freq: Every day | ORAL | Status: DC
  Administered 2015-12-27 – 2015-12-28 (×2): 88 ug via ORAL
  Filled 2015-12-25 (×2): qty 1

## 2015-12-25 MED ORDER — SODIUM CHLORIDE 0.9 % IV SOLN
INTRAVENOUS | Status: AC
  Administered 2015-12-26: 05:00:00 via INTRAVENOUS

## 2015-12-25 MED ORDER — LEVOFLOXACIN 250 MG PO TABS
250.0000 mg | ORAL_TABLET | Freq: Every day | ORAL | Status: AC
  Administered 2015-12-27: 250 mg via ORAL
  Filled 2015-12-25 (×3): qty 1

## 2015-12-25 MED ORDER — HYDROCODONE-ACETAMINOPHEN 5-325 MG PO TABS: 1.0000 | ORAL_TABLET | Freq: Four times a day (QID) | ORAL | Status: DC | PRN

## 2015-12-25 MED ORDER — VITAMIN D (ERGOCALCIFEROL) 1.25 MG (50000 UNIT) PO CAPS
50000.0000 [IU] | ORAL_CAPSULE | ORAL | Status: DC
  Filled 2015-12-25: qty 1

## 2015-12-25 MED ORDER — NITROGLYCERIN 0.4 MG SL SUBL: 0.4000 mg | SUBLINGUAL_TABLET | SUBLINGUAL | Status: DC | PRN

## 2015-12-25 MED ORDER — SENNOSIDES-DOCUSATE SODIUM 8.6-50 MG PO TABS
1.0000 | ORAL_TABLET | Freq: Every day | ORAL | Status: DC
  Administered 2015-12-26 – 2015-12-27 (×2): 1 via ORAL
  Filled 2015-12-25 (×2): qty 1

## 2015-12-25 MED ORDER — SODIUM CHLORIDE 0.9 % IV BOLUS (SEPSIS)
500.0000 mL | Freq: Once | INTRAVENOUS | Status: AC
  Administered 2015-12-25: 500 mL via INTRAVENOUS

## 2015-12-25 MED ORDER — MELATONIN 3 MG PO TABS
1.0000 | ORAL_TABLET | Freq: Every day | ORAL | Status: DC
  Filled 2015-12-25 (×2): qty 1

## 2015-12-25 NOTE — ED Notes (Addendum)
Pt changed. Large amount of red blood in brief with large clots noted. Dr Jeraldine LootsLockwood aware

## 2015-12-25 NOTE — H&P (Signed)
Patient Demographics:    [name], is a 82 y.o. female  MRN: [id number]   DOB - 01/04/1931  Admit Date - 12/25/2015  Outpatient Primary MD for the patient is Fredirick MaudlinHAWKINS,EDWARD L, MD   Assessment & Plan:    Active Problems:   Rectal bleeding  1)GI Bleed- Per rectum, with blood clots, Hgb stable- 14. Normotensive, but with tachycardic. Prior colonoscopies, ?results, No prior CT abd per chart review. Per nursing home was on Aspirin 325 for ~3 weeks, otherwise, no NSAID use, no Abd pain, no hematemesis. Has hx of hemorrhoids. - Admit to tele - CBC- Q8H - Gi consulted in the ED, will see pt here at [facility] - Iv protonix 40mg  daily - NPO except for meds - Type and match. - IVF N/s 100cc/hr for 1 day  2) UTI- With leukocytosis- 14, UA suggestive- with leukocytes and bact, lower abd tenderness. No fever. Penicillin allergy. - Levaquin 250mg  daily X3 days - Urine cultures  3)- Hypothyroidism- Cont home meds- Synthroid 88mcg daily.  4) Alzheimers Dementia- At baseline, pts barely recognizes family members. Permanent nursing home resident. - Swallow eval, pt is on a dysphagia diet.  5). 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 - Repeat EKG.   Reviewed by me  Past Medical History:  Diagnosis Date  . Acid reflux   . Alzheimer's dementia   . Cognitive communication disorder   . Dementia   . Dysphagia   . Dysphagia   . Frequent falls   . Hypokalemia   . Lack of coordination   . Major depressive disorder   . Nondisplaced fracture    left humerus  . Peripheral vascular disorder (HCC)   . PVD (peripheral vascular disease) (HCC)   . Thyroid disease   . UTI (urinary tract infection)       Past Surgical History:  Procedure Laterality Date  . HEMORROIDECTOMY    . HIP ARTHROPLASTY Left 11/09/2015   Procedure: ARTHROPLASTY BIPOLAR HIP (HEMIARTHROPLASTY);  Surgeon: Vickki HearingStanley E Harrison, MD;  Location: [facility];  Service: Orthopedics;  Laterality: Left;  0730      Chief Complaint  Patient presents with  . Rectal Bleeding      HPI:   Due to pts significant dementia, Hx was obtained from grand-daughters present and nursing home staff.   [name]  is a 82 y.o. female, with PMH- Alzheimers dementia, hypothyroidism, recent hip fracture, presented today with reported bleeding per rectum, noted [facility]. Per staff, pt has blood clots in diapers, they cannot otherwise characterize the blood. Per ED note- pts aide reported 2 large bright red blood clots the size of a grapefruit or orange.  Pt has a hx of hemorrhoids, No prior hx of bleeding, no known hx of diverticulosis, or peptic ulcers, no hematemesis. I Called the nursing home, and she was place don Aspirin 325mg  daily for ~3 weeks, this was discont 12/10/15,  otherwise no other NSAID use. She had recent hip fracture but was on Norco for that, no other events of complaints. Pt never smoked cigs, never drank alcohol.    Review of systems:    Due to pts significant dementia, ROS could not be obtained.    Social History:  Reviewed by me   Social History  Substance Use Topics  . Smoking status: Never Smoker  . Smokeless tobacco: Never Used  . Alcohol use No    Family History :  Reviewed by me   History reviewed. No pertinent family history.   Home Medications:   Prior to Admission medications   Medication Sig Start Date End Date Taking? Authorizing Provider  aspirin 81 MG chewable tablet Chew 81 mg by mouth daily.   Yes Historical Provider, MD  HYDROcodone-acetaminophen (NORCO/VICODIN) 5-325 MG tablet Take 1-2 tablets by mouth every 6 (six) hours as needed for moderate pain. 11/13/15  Yes Kari BaarsEdward Hawkins, MD  levothyroxine (SYNTHROID, LEVOTHROID) 88 MCG tablet Take 88 mcg by mouth daily.      Yes Historical Provider, MD  Melatonin 3 MG TABS Take 1 tablet by mouth at bedtime.   Yes Historical Provider, MD  omeprazole (PRILOSEC) 20 MG capsule Take 20 mg by mouth daily. on empty stomach   Yes Historical Provider, MD  Vitamin D, Ergocalciferol, (DRISDOL) 50000 units CAPS capsule Take 50,000 Units by mouth every 7 (seven) days.   Yes Historical Provider, MD  nitroGLYCERIN (NITROSTAT) 0.4 MG SL tablet Place 0.4 mg under the tongue every 5 (five) minutes as needed for chest pain.    Historical Provider, MD     Allergies:     Allergies  Allergen Reactions  . Penicillins      Physical Exam:   Vitals  Blood pressure 106/74, pulse 103, temperature 97.3 F (36.3 C), temperature source Oral, resp. rate 23, height 5\' 8"  (1.727 m), weight 63.5 kg (140 lb), SpO2 94 %.  Physical Examination: General appearance - alert, frail, and in no distress Mental status - alert, follows few commands, inappropriate response to some questions. Eyes - sclera anicteric Neck - supple, no LN enlargement Chest - clear  to auscultation bilaterally, symmetrical air movement, no wheezes or crackles. Heart - S1 and S2 normal, regular Abdomen - soft, tenderness suprapubic region, nondistended, no masses or organomegaly Neurological - limited exam due to patient mental status, but moves all extremities,  Extremities - no pedal edema noted, intact peripheral pulses  Skin - warm, dry   Data Review:    CBC  Recent Labs Lab 12/25/15 1400  WBC 14.2*  HGB 14.8  HCT 43.8  PLT 351  MCV 93.0  MCH 31.4  MCHC 33.8  RDW 14.3  LYMPHSABS 1.7  MONOABS 1.1*  EOSABS 0.2  BASOSABS 0.0   ------------------------------------------------------------------------------------------------------------------  Chemistries   Recent Labs Lab 12/25/15 1400  NA 136  K 3.9  CL 104  CO2 24  GLUCOSE 140*  BUN 20  CREATININE 0.75  CALCIUM 8.7*  AST 55*  ALT 52  ALKPHOS 88  BILITOT 0.6    ------------------------------------------------------------------------------------------------------------------ estimated creatinine clearance is 53.4 mL/min (by C-G formula based on SCr of 0.75 mg/dL). ------------------------------------------------------------------------------------------------------------------ No results for input(s): TSH, T4TOTAL, T3FREE, THYROIDAB in the last 72 hours.  Invalid input(s): FREET3   Coagulation profile No results for input(s): INR, PROTIME in the last 168 hours. ------------------------------------------------------------------------------------------------------------------- No results for input(s): DDIMER in the last 72 hours. -------------------------------------------------------------------------------------------------------------------  Cardiac Enzymes  Recent Labs Lab 12/25/15 1400  TROPONINI <  0.03   ------------------------------------------------------------------------------------------------------------------ No results found for: BNP   ---------------------------------------------------------------------------------------------------------------  Urinalysis    Component Value Date/Time   COLORURINE AMBER (A) 12/25/2015 1425   APPEARANCEUR CLOUDY (A) 12/25/2015 1425   LABSPEC 1.020 12/25/2015 1425   PHURINE 6.0 12/25/2015 1425   GLUCOSEU NEGATIVE 12/25/2015 1425   HGBUR MODERATE (A) 12/25/2015 1425   BILIRUBINUR NEGATIVE 12/25/2015 1425   KETONESUR NEGATIVE 12/25/2015 1425   PROTEINUR NEGATIVE 12/25/2015 1425   UROBILINOGEN 1.0 06/16/2012 1415   NITRITE NEGATIVE 12/25/2015 1425   LEUKOCYTESUR LARGE (A) 12/25/2015 1425   EKG- Old RBBB, LAFB.  ----------------------------------------------------------------------------------------------------------------   Imaging Results:    No results found.  Radiological Exams on Admission: No results found.  DVT Prophylaxis -SCD   AM Labs Ordered, also please review Full  Orders  Family Communication: Admission, patients condition and plan of care including tests being ordered have been discussed with the patient who indicate understanding and agree with the plan   Code Status - Full Code- Pt was previously DNR. But grand daughter who says she and her father are now pts legal guardian, want pt full code. I have requested documentation attest to legal guardianship, which they will bring. Considering significant dementia and age I explained to family, what being a full code would mean for pt and her quality of life.   Likely DC to  SNF  Condition  - Fair.  Onnie Boer M.D on 12/25/2015 at 5:40 PM  Pager no- [phone]  After 7pm go to [email or web address] - password [facility]  [facility] - Office  [phone]

## 2015-12-25 NOTE — ED Notes (Signed)
Pt changed at this time. Large amount of bright red blood noted in brief. Continually oozing blood from rectum

## 2015-12-25 NOTE — ED Provider Notes (Signed)
[facility]-EMERGENCY DEPT Provider Note   CSN: [id number] Arrival date & time: 12/25/15  1340  By signing my name below, I, Doreatha MartinEva Mathews, attest that this documentation has been prepared under the direction and in the presence of Gerhard Munchobert Trayton Szabo, MD. Electronically Signed: Doreatha MartinEva Mathews, ED Scribe. 12/25/15. 1:54 PM.    History   Chief Complaint Chief Complaint  Patient presents with  . Rectal Bleeding   LEVEL 5 CAVEAT: HPI and ROS limited due to dementia    HPI [name] is a 82 y.o. female with h/o dementia brought in by ambulance who presents to the Emergency Department for evaluation of possible GI bleed this morning. [facility] where staff reported rectal bleeding upon diaper change this morning. Per NP at SNF, pts aide reported 2 large bright red blood clots the size of a grapefruit or orange. Facility reports that pt is at her baseline mental status. Facility denies fever, behavioral changes.   The history is provided by the patient, the EMS personnel and the nursing home. The history is limited by the absence of a caregiver. No language interpreter was used.    Past Medical History:  Diagnosis Date  . Alzheimer's dementia   . Dementia   . Dysphagia   . Hypokalemia   . Major depressive disorder   . PVD (peripheral vascular disease) (HCC)   . Thyroid disease     Patient Active Problem List   Diagnosis Date Noted  . Malnutrition of moderate degree 11/09/2015  . Closed displaced fracture of left femoral neck (HCC) 11/07/2015  . Closed fracture of head of left humerus 11/07/2015  . Hip fracture (HCC) 11/07/2015  . Alzheimer's dementia 06/18/2012  . Altered mental status 06/16/2012  . Alzheimer's dementia without behavioral disturbance 10/15/2011  . Complicated UTI (urinary tract infection) 10/09/2011  . Acute renal failure (HCC) 10/09/2011  . GERD (gastroesophageal reflux disease) 10/09/2011  . Hypothyroidism 10/09/2011    Past Surgical  History:  Procedure Laterality Date  . HEMORROIDECTOMY    . HIP ARTHROPLASTY Left 11/09/2015   Procedure: ARTHROPLASTY BIPOLAR HIP (HEMIARTHROPLASTY);  Surgeon: Vickki HearingStanley E Harrison, MD;  Location: [facility] OR[facility];  Service: Orthopedics;  Laterality: Left;  0730    OB History    No data available       Home Medications    Prior to Admission medications   Medication Sig Start Date End Date Taking? Authorizing Provider  aspirin 81 MG chewable tablet Chew 81 mg by mouth daily.    Historical Provider, MD  feeding supplement, ENSURE ENLIVE, (ENSURE ENLIVE) LIQD Take 237 mLs by mouth 2 (two) times daily between meals. 11/13/15   Kari BaarsEdward Hawkins, MD  HYDROcodone-acetaminophen (NORCO/VICODIN) 5-325 MG tablet Take 1-2 tablets by mouth every 6 (six) hours as needed for moderate pain. 11/13/15   Kari BaarsEdward Hawkins, MD  levothyroxine (SYNTHROID, LEVOTHROID) 88 MCG tablet Take 88 mcg by mouth daily.      Historical Provider, MD  Melatonin 3 MG TABS Take 1 tablet by mouth at bedtime.    Historical Provider, MD  nitroGLYCERIN (NITROSTAT) 0.4 MG SL tablet Place 0.4 mg under the tongue every 5 (five) minutes as needed for chest pain.    Historical Provider, MD  saccharomyces boulardii (FLORASTOR) 250 MG capsule Take 250 mg by mouth daily. For 28 days.    Historical Provider, MD  Vitamin D, Ergocalciferol, (DRISDOL) 50000 units CAPS capsule Take 50,000 Units by mouth every 7 (seven) days.    Historical Provider, MD  Family History No family history on file.  Social History Social History  Substance Use Topics  . Smoking status: Never Smoker  . Smokeless tobacco: Never Used  . Alcohol use No     Allergies   Penicillins   Review of Systems Review of Systems  Unable to perform ROS: Dementia     Physical Exam Updated Vital Signs There were no vitals taken for this visit.  Physical Exam  Constitutional: No distress.  Sickly, frail-appearing elderly female  HENT:  Head: Normocephalic and atraumatic.    Eyes: Conjunctivae and EOM are normal.  Cardiovascular: Regular rhythm.   Tachycardic  Pulmonary/Chest: Effort normal and breath sounds normal. No stridor. No respiratory distress.  Abdominal: She exhibits no distension. There is no tenderness. There is no guarding.  Musculoskeletal: She exhibits no edema.  Neurological: She is alert. No cranial nerve deficit.  Moves all extremity spontaneously, does not follow neurologic commands  Skin: Skin is warm and dry. There is pallor.  Psychiatric: She is withdrawn. Cognition and memory are impaired.  Nursing note and vitals reviewed.    ED Treatments / Results  Labs (all labs ordered are listed, but only abnormal results are displayed) Labs Reviewed  COMPREHENSIVE METABOLIC PANEL  LIPASE, BLOOD  TROPONIN I  CBC WITH DIFFERENTIAL/PLATELET  OCCULT BLOOD X 1 CARD TO LAB, STOOL  TYPE AND SCREEN    EKG  EKG Interpretation  Date/Time:  Saturday December 25 2015 13:46:57 EST Ventricular Rate:  112 PR Interval:    QRS Duration: 152 QT Interval:  407 QTC Calculation: 549 R Axis:   -94 Text Interpretation:  Sinus tachycardia Atrial premature complex RBBB and LAFB Artifact Abnormal ekg Confirmed by Gerhard MunchLOCKWOOD, Kedarius Aloisi  MD [phone]) on 12/25/2015 3:33:34 PM       Procedures Procedures (including critical care time)  Medications Ordered in ED Medications  sodium chloride 0.9 % bolus 500 mL (0 mLs Intravenous Stopped 12/25/15 1543)    After the initial evaluation, patient received IV fluids, labs sent. Subsequent, the patient's family members arrived, they note that the patient denies similar rectal bleeding last saw her, at least several days ago. The patient continues to have progressive blood per rectum. Initial labs somewhat reassuring, with no evidence for a hemoglobin drop. I discussed patient's case with our gastroenterology colleague, Dr. Darrick PennaFields, then with our hospitalist for admission.    I personally performed the services  described in this documentation, which was scribed in my presence. The recorded information has been reviewed and is accurate.       Gerhard Munchobert Bisma Klett, MD 12/25/15 [phone]

## 2015-12-25 NOTE — ED Triage Notes (Addendum)
[facility], facility reports went to clean and change patient this am and noted two large clots and bright red bleeding to patient diaper. Pt at baseline mentation.

## 2015-12-25 NOTE — ED Notes (Signed)
Facility staff called and inquired about patient update. [facility], fax unsuccessful. Facility reported pt daily medication via phone. Pharmacy Tech aware.

## 2015-12-25 NOTE — ED Notes (Signed)
[facility]. Pt is on pureed diet with thin liquids

## 2015-12-25 NOTE — ED Notes (Signed)
Family at bedside. 

## 2015-12-25 NOTE — ED Notes (Signed)
Hospitalist at bedside 

## 2015-12-26 ENCOUNTER — Encounter (HOSPITAL_COMMUNITY): Payer: Self-pay | Admitting: Gastroenterology

## 2015-12-26 DIAGNOSIS — K625 Hemorrhage of anus and rectum: Secondary | ICD-10-CM | POA: Diagnosis not present

## 2015-12-26 DIAGNOSIS — K648 Other hemorrhoids: Secondary | ICD-10-CM | POA: Diagnosis not present

## 2015-12-26 LAB — CBC
HCT: 35.3 % — ABNORMAL LOW (ref 36.0–46.0)
HCT: 33.6 % — ABNORMAL LOW (ref 36.0–46.0)
HEMATOCRIT: 36.5 % (ref 36.0–46.0)
HEMOGLOBIN: 11.5 g/dL — AB (ref 12.0–15.0)
HEMOGLOBIN: 11.2 g/dL — AB (ref 12.0–15.0)
Hemoglobin: 10.7 g/dL — ABNORMAL LOW (ref 12.0–15.0)
MCH: 29.7 pg (ref 26.0–34.0)
MCH: 29.9 pg (ref 26.0–34.0)
MCH: 30.1 pg (ref 26.0–34.0)
MCHC: 31.5 g/dL (ref 30.0–36.0)
MCHC: 31.7 g/dL (ref 30.0–36.0)
MCHC: 31.8 g/dL (ref 30.0–36.0)
MCV: 94.3 fL (ref 78.0–100.0)
MCV: 94.1 fL (ref 78.0–100.0)
MCV: 94.4 fL (ref 78.0–100.0)
PLATELETS: 273 10*3/uL (ref 150–400)
PLATELETS: 265 10*3/uL (ref 150–400)
Platelets: 323 10*3/uL (ref 150–400)
RBC: 3.87 MIL/uL (ref 3.87–5.11)
RBC: 3.75 MIL/uL — ABNORMAL LOW (ref 3.87–5.11)
RBC: 3.56 MIL/uL — AB (ref 3.87–5.11)
RDW: 14.5 % (ref 11.5–15.5)
RDW: 14.3 % (ref 11.5–15.5)
RDW: 14.3 % (ref 11.5–15.5)
WBC: 12.6 10*3/uL — AB (ref 4.0–10.5)
WBC: 10.3 10*3/uL (ref 4.0–10.5)
WBC: 9.6 10*3/uL (ref 4.0–10.5)

## 2015-12-26 LAB — TYPE AND SCREEN
ABO/RH(D): A POS
Antibody Screen: NEGATIVE

## 2015-12-26 MED ORDER — NYSTATIN 100000 UNIT/GM EX POWD
Freq: Two times a day (BID) | CUTANEOUS | Status: DC
  Administered 2015-12-26 – 2015-12-28 (×4): via TOPICAL
  Filled 2015-12-26: qty 15

## 2015-12-26 MED ORDER — VITAMIN D (ERGOCALCIFEROL) 1.25 MG (50000 UNIT) PO CAPS
50000.0000 [IU] | ORAL_CAPSULE | ORAL | Status: DC
  Administered 2015-12-27: 50000 [IU] via ORAL
  Filled 2015-12-26: qty 1

## 2015-12-26 MED ORDER — PANTOPRAZOLE SODIUM 40 MG IV SOLR
40.0000 mg | INTRAVENOUS | Status: DC
  Administered 2015-12-26: 40 mg via INTRAVENOUS
  Filled 2015-12-26: qty 40

## 2015-12-26 MED ORDER — HYDROCORTISONE 2.5 % RE CREA
TOPICAL_CREAM | Freq: Four times a day (QID) | RECTAL | Status: DC
  Administered 2015-12-26 – 2015-12-28 (×5): via RECTAL
  Filled 2015-12-26 (×2): qty 28.35

## 2015-12-26 MED ORDER — PANTOPRAZOLE SODIUM 40 MG PO TBEC: 40.0000 mg | DELAYED_RELEASE_TABLET | Freq: Every day | ORAL | Status: DC

## 2015-12-26 MED ORDER — MAGNESIUM HYDROXIDE 400 MG/5ML PO SUSP
30.0000 mL | Freq: Two times a day (BID) | ORAL | Status: DC
  Administered 2015-12-27 – 2015-12-28 (×3): 30 mL via ORAL
  Filled 2015-12-26 (×3): qty 30

## 2015-12-26 NOTE — Progress Notes (Signed)
Subjective: This is an 82 year old who was admitted yesterday afternoon with rectal bleeding. She has had slow drift of her hemoglobin but not at a point that she needs a blood transfusion. She was tachycardic earlier but better now. She may have a urinary tract infection as well. She has severe dementia at baseline  Objective: Vital signs in last 24 hours: Temp:  [97.3 F (36.3 C)-99.2 F (37.3 C)] 98.3 F (36.8 C) (11/19 0500) Pulse Rate:  [80-113] 80 (11/19 0500) Resp:  [15-23] 18 (11/19 0500) BP: (100-140)/(64-98) 100/64 (11/19 0500) SpO2:  [91 %-96 %] 96 % (11/19 0500) Weight:  [58.4 kg (128 lb 12.8 oz)-63.5 kg (140 lb)] 58.4 kg (128 lb 12.8 oz) (11/18 1851) Weight change:  Last BM Date:  (bloddy stools )  Intake/Output from previous day: 11/18 0701 - 11/19 0700 In: 1016.7 [I.V.:1016.7] Out: -   PHYSICAL EXAM General appearance: She is arousable but very confused. Resp: clear to auscultation bilaterally Cardio: regular rate and rhythm, S1, S2 normal, no murmur, click, rub or gallop GI: soft, non-tender; bowel sounds normal; no masses,  no organomegaly Extremities: extremities normal, atraumatic, no cyanosis or edema Skin warm and dry. Mucous membranes dry.  Lab Results:  Results for orders placed or performed during the hospital encounter of 12/25/15 (from the past 48 hour(s))  Comprehensive metabolic panel     Status: Abnormal   Collection Time: 12/25/15  2:00 PM  Result Value Ref Range   Sodium 136 135 - 145 mmol/L   Potassium 3.9 3.5 - 5.1 mmol/L   Chloride 104 101 - 111 mmol/L   CO2 24 22 - 32 mmol/L   Glucose, Bld 140 (H) 65 - 99 mg/dL   BUN 20 6 - 20 mg/dL   Creatinine, Ser 0.75 0.44 - 1.00 mg/dL   Calcium 8.7 (L) 8.9 - 10.3 mg/dL   Total Protein 8.1 6.5 - 8.1 g/dL   Albumin 3.1 (L) 3.5 - 5.0 g/dL   AST 55 (H) 15 - 41 U/L   ALT 52 14 - 54 U/L   Alkaline Phosphatase 88 38 - 126 U/L   Total Bilirubin 0.6 0.3 - 1.2 mg/dL   GFR calc non Af Amer >60 >60 mL/min    GFR calc Af Amer >60 >60 mL/min    Comment: (NOTE) The eGFR has been calculated using the CKD EPI equation. This calculation has not been validated in all clinical situations. eGFR's persistently <60 mL/min signify possible Chronic Kidney Disease.    Anion gap 8 5 - 15  Lipase, blood     Status: None   Collection Time: 12/25/15  2:00 PM  Result Value Ref Range   Lipase 26 11 - 51 U/L  Troponin I     Status: None   Collection Time: 12/25/15  2:00 PM  Result Value Ref Range   Troponin I <0.03 <0.03 ng/mL  CBC with Differential     Status: Abnormal   Collection Time: 12/25/15  2:00 PM  Result Value Ref Range   WBC 14.2 (H) 4.0 - 10.5 K/uL   RBC 4.71 3.87 - 5.11 MIL/uL   Hemoglobin 14.8 12.0 - 15.0 g/dL   HCT 43.8 36.0 - 46.0 %   MCV 93.0 78.0 - 100.0 fL   MCH 31.4 26.0 - 34.0 pg   MCHC 33.8 30.0 - 36.0 g/dL   RDW 14.3 11.5 - 15.5 %   Platelets 351 150 - 400 K/uL   Neutrophils Relative % 79 %   Neutro Abs 11.2 (  H) 1.7 - 7.7 K/uL   Lymphocytes Relative 12 %   Lymphs Abs 1.7 0.7 - 4.0 K/uL   Monocytes Relative 8 %   Monocytes Absolute 1.1 (H) 0.1 - 1.0 K/uL   Eosinophils Relative 1 %   Eosinophils Absolute 0.2 0.0 - 0.7 K/uL   Basophils Relative 0 %   Basophils Absolute 0.0 0.0 - 0.1 K/uL  Type and screen [facility]     Status: None   Collection Time: 12/25/15  2:00 PM  Result Value Ref Range   ABO/RH(D) A POS    Antibody Screen NEG    Sample Expiration 12/28/2015   Urinalysis, Routine w reflex microscopic     Status: Abnormal   Collection Time: 12/25/15  2:25 PM  Result Value Ref Range   Color, Urine AMBER (A) YELLOW    Comment: BIOCHEMICALS MAY BE AFFECTED BY COLOR   APPearance CLOUDY (A) CLEAR   Specific Gravity, Urine 1.020 1.005 - 1.030   pH 6.0 5.0 - 8.0   Glucose, UA NEGATIVE NEGATIVE mg/dL   Hgb urine dipstick MODERATE (A) NEGATIVE   Bilirubin Urine NEGATIVE NEGATIVE   Ketones, ur NEGATIVE NEGATIVE mg/dL   Protein, ur NEGATIVE NEGATIVE mg/dL    Nitrite NEGATIVE NEGATIVE   Leukocytes, UA LARGE (A) NEGATIVE  Urine microscopic-add on     Status: Abnormal   Collection Time: 12/25/15  2:25 PM  Result Value Ref Range   Squamous Epithelial / LPF 0-5 (A) NONE SEEN   WBC, UA TOO NUMEROUS TO COUNT 0 - 5 WBC/hpf   RBC / HPF 6-30 0 - 5 RBC/hpf   Bacteria, UA MANY (A) NONE SEEN  CBC     Status: Abnormal   Collection Time: 12/25/15  7:49 PM  Result Value Ref Range   WBC 14.2 (H) 4.0 - 10.5 K/uL   RBC 4.24 3.87 - 5.11 MIL/uL   Hemoglobin 12.9 12.0 - 15.0 g/dL   HCT 39.4 36.0 - 46.0 %   MCV 92.9 78.0 - 100.0 fL   MCH 30.4 26.0 - 34.0 pg   MCHC 32.7 30.0 - 36.0 g/dL   RDW 14.4 11.5 - 15.5 %   Platelets 341 150 - 400 K/uL  MRSA PCR Screening     Status: Abnormal   Collection Time: 12/25/15  8:42 PM  Result Value Ref Range   MRSA by PCR POSITIVE (A) NEGATIVE    Comment:        The GeneXpert MRSA Assay (FDA approved for NASAL specimens only), is one component of a comprehensive MRSA colonization surveillance program. It is not intended to diagnose MRSA infection nor to guide or monitor treatment for MRSA infections. RESULT CALLED TO, READ BACK BY AND VERIFIED WITH: SULY,S AT 2326 ON 12/25/15 BY MOSLEY,J   CBC     Status: Abnormal   Collection Time: 12/26/15  4:01 AM  Result Value Ref Range   WBC 12.6 (H) 4.0 - 10.5 K/uL   RBC 3.87 3.87 - 5.11 MIL/uL   Hemoglobin 11.5 (L) 12.0 - 15.0 g/dL   HCT 36.5 36.0 - 46.0 %   MCV 94.3 78.0 - 100.0 fL   MCH 29.7 26.0 - 34.0 pg   MCHC 31.5 30.0 - 36.0 g/dL   RDW 14.5 11.5 - 15.5 %   Platelets 323 150 - 400 K/uL    ABGS No results for input(s): PHART, PO2ART, TCO2, HCO3 in the last 72 hours.  Invalid input(s): PCO2 CULTURES Recent Results (from the past 240 hour(s))  MRSA PCR Screening     Status: Abnormal   Collection Time: 12/25/15  8:42 PM  Result Value Ref Range Status   MRSA by PCR POSITIVE (A) NEGATIVE Final    Comment:        The GeneXpert MRSA Assay (FDA approved for  NASAL specimens only), is one component of a comprehensive MRSA colonization surveillance program. It is not intended to diagnose MRSA infection nor to guide or monitor treatment for MRSA infections. RESULT CALLED TO, READ BACK BY AND VERIFIED WITH: SULY,S AT 2326 ON 12/25/15 BY MOSLEY,J    Studies/Results: No results found.  Medications:  Prior to Admission:  Prescriptions Prior to Admission  Medication Sig Dispense Refill Last Dose  . aspirin 81 MG chewable tablet Chew 81 mg by mouth daily.   12/24/2015 at Unknown time  . HYDROcodone-acetaminophen (NORCO/VICODIN) 5-325 MG tablet Take 1-2 tablets by mouth every 6 (six) hours as needed for moderate pain. 30 tablet 0 12/24/2015 at Unknown time  . levothyroxine (SYNTHROID, LEVOTHROID) 88 MCG tablet Take 88 mcg by mouth daily.     12/24/2015 at Unknown time  . Melatonin 3 MG TABS Take 1 tablet by mouth at bedtime.   12/24/2015 at Unknown time  . omeprazole (PRILOSEC) 20 MG capsule Take 20 mg by mouth daily. on empty stomach   12/25/2015 at Unknown time  . Vitamin D, Ergocalciferol, (DRISDOL) 50000 units CAPS capsule Take 50,000 Units by mouth every 7 (seven) days.   Past Month at Unknown time  . nitroGLYCERIN (NITROSTAT) 0.4 MG SL tablet Place 0.4 mg under the tongue every 5 (five) minutes as needed for chest pain.    at unknown   Scheduled: . levofloxacin  250 mg Oral Daily  . levothyroxine  88 mcg Oral QAC breakfast  . pantoprazole  40 mg Oral Q1200  . senna-docusate  1 tablet Oral QHS  . [START ON 12/27/2015] Vitamin D (Ergocalciferol)  50,000 Units Oral Q7 days   Continuous: . sodium chloride 100 mL/hr at 12/26/15 0516   FVO:[id number], nitroGLYCERIN  Assesment:She was admitted with rectal bleeding. She is hemodynamically stable but her hemoglobin has dropped. I think some of this is delusional. I think she was probably somewhat dehydrated on admission. She also has a urinary tract infection which is being  treated. At baseline she has severe dementia which is unchanged Active Problems:   Rectal bleeding   GI bleed    Plan: Continue to monitor hemoglobin level. Continue IV fluids. Continue nothing by mouth for now. GI consultation requested.    LOS: 1 day   Xion Debruyne L 12/26/2015, 10:43 AM

## 2015-12-26 NOTE — Progress Notes (Signed)
[name] the patients legal guardian had a conversation with me about the patients code status.  She states that she would like to have the patients code status from Full Code to DNR.  Dr. Felecia ShellingFanta was notified of the situation and new orders were given.  I notified Cicero Duckrika of the order change.  She verbalized understanding.

## 2015-12-26 NOTE — Consult Note (Addendum)
Referring Provider: No ref. provider found Primary Care Physician:  Fredirick MaudlinHAWKINS,EDWARD L, MD Primary Gastroenterologist:  DR. Jena GaussOURK  Reason for Consultation:  RECTAL BLEEDING   Impression: ADMITTED WTH RECTAL BLEEDING. UNABLE TO COMMUNICATE. HAS BEEN BEDRIDDEN SINCE HIP FRACTURE SURGERY. DIFFERENTIAL DIAGNOSIS INCLUDES HEMORRHOIDS, STERCORAL ULCERS, LESS LIKELY COLON POLYPS, AVMs, OR  COLON CA.  Plan: 1. ADVANCE DIET. ASSIST WITH MEALS. 2. SUPPORTIVE CARE 3. ADD ANUSOL CREAM QID 4. CONTINUE TO MONITOR SYMPTOMS. IF CONTINUES TO BLEED, CONSIDER FLEX SIG. PT NOT A CANDIDATE FOR TCS DUE TO SEVERE COGNITIVE IMPAIRMENT. 5. MOM 30 ML BID. CONSIDER LINZESS.  DISCUSSED PLAN OF CARE WITH SON AND NIECE.      HPI:  LIMITED DUE TO PATENT HAS SEVERE COGNITIVE DYSFUNCTION. SON GIVES HISTORY AND EMR REVIEWED FROM 2003 TO PRESENT. PT HAS BEEN IN SNF SINCE D/C OCT 2017 AFTER FALL/HIP FRACTURE-REPAIR. HAD ACUTE ONSET OF RECTAL BLEEDING. SON THOUGHT IT WAS A LOT. TCS 203/2008: INTERNAL HEMORRHOIDS, NO DIVERTICULOSIS DOCUMENTED.     Past Medical History:  Diagnosis Date  . Acid reflux   . Alzheimer's dementia   . Cognitive communication disorder   . Dementia   . Dysphagia   . Dysphagia   . Frequent falls   . Hypokalemia   . Lack of coordination   . Major depressive disorder   . Nondisplaced fracture    left humerus  . Peripheral vascular disorder (HCC)   . PVD (peripheral vascular disease) (HCC)   . Thyroid disease   . UTI (urinary tract infection)     Past Surgical History:  Procedure Laterality Date  . HEMORROIDECTOMY    . HIP ARTHROPLASTY Left 11/09/2015   Procedure: ARTHROPLASTY BIPOLAR HIP (HEMIARTHROPLASTY);  Surgeon: Vickki HearingStanley E Harrison, MD;  Location: [facility];  Service: Orthopedics;  Laterality: Left;  0730    Prior to Admission medications   Medication Sig Start Date End Date Taking? Authorizing Provider  aspirin 81 MG chewable tablet Chew 81 mg by mouth daily.   Yes  Historical Provider, MD  HYDROcodone-acetaminophen (NORCO/VICODIN) 5-325 MG tablet Take 1-2 tablets by mouth every 6 (six) hours as needed for moderate pain. 11/13/15  Yes Kari BaarsEdward Hawkins, MD  levothyroxine (SYNTHROID, LEVOTHROID) 88 MCG tablet Take 88 mcg by mouth daily.     Yes Historical Provider, MD  Melatonin 3 MG TABS Take 1 tablet by mouth at bedtime.   Yes Historical Provider, MD  omeprazole (PRILOSEC) 20 MG capsule Take 20 mg by mouth daily. on empty stomach   Yes Historical Provider, MD  Vitamin D, Ergocalciferol, (DRISDOL) 50000 units CAPS capsule Take 50,000 Units by mouth every 7 (seven) days.   Yes Historical Provider, MD  nitroGLYCERIN (NITROSTAT) 0.4 MG SL tablet Place 0.4 mg under the tongue every 5 (five) minutes as needed for chest pain.    Historical Provider, MD    Current Facility-Administered Medications  Medication Dose Route Frequency Provider Last Rate Last Dose  . 0.9 %  sodium chloride infusion   Intravenous Continuous Onnie BoerEjiroghene E Emokpae, MD 100 mL/hr at 12/26/15 0516    . HYDROcodone-acetaminophen (NORCO/VICODIN) 5-325 MG per tablet 1 tablet  1 tablet Oral Q6H PRN Ejiroghene E Emokpae, MD      . levofloxacin (LEVAQUIN) tablet 250 mg  250 mg Oral Daily Ejiroghene E Emokpae, MD      . levothyroxine (SYNTHROID, LEVOTHROID) tablet 88 mcg  88 mcg Oral QAC breakfast Ejiroghene E Emokpae, MD      . nitroGLYCERIN (NITROSTAT) SL tablet 0.4 mg  0.4 mg  Sublingual Q5 min PRN Ejiroghene E Emokpae, MD      . senna-docusate (Senokot-S) tablet 1 tablet  1 tablet Oral QHS Ejiroghene Wendall StadeE Emokpae, MD      . Melene Muller[START ON 12/27/2015] Vitamin D (Ergocalciferol) (DRISDOL) capsule 50,000 Units  50,000 Units Oral Q7 days Kari BaarsEdward Hawkins, MD        Allergies as of 12/25/2015 - Review Complete 12/25/2015  Allergen Reaction Noted  . Penicillins  10/08/2011    History reviewed. No pertinent family history.   Social History   Social History  . Marital status: Widowed    Spouse name: N/A  .  Number of children: N/A  . Years of education: N/A   Occupational History  . Not on file.   Social History Main Topics  . Smoking status: Never Smoker  . Smokeless tobacco: Never Used  . Alcohol use No  . Drug use: No  . Sexual activity: Not Currently   Other Topics Concern  . Not on file   Social History Narrative  . No narrative on file    Review of Systems: PER HPI OTHERWISE ALL SYSTEMS ARE NEGATIVE.   Vitals: Blood pressure 100/64, pulse 80, temperature 98.3 F (36.8 C), temperature source Oral, resp. rate 18, height 5\' 6"  (1.676 m), weight 128 lb 12.8 oz (58.4 kg), SpO2 96 %.  Physical Exam: General:   AROUSABLE, UANBLE TO COMMUNICATE BUT LAUGHS WHEN STIMULLATED, in NAD Head:  Normocephalic and atraumatic. Eyes:  Sclera clear, no icterus.   Conjunctiva pink. Mouth:  No lesions, POOR dentition. Neck:  Supple; no masses. Lungs:  Clear throughout to auscultation.   No wheezes. No acute distress. Heart:  Regular rate and IRREGULAR rhythm; no murmurs APPRECIATED. Abdomen:  Soft, nontender and nondistended. No masses noted. Normal bowel sounds, without guarding, and without rebound.   Msk:  Symmetrical. Extremities:  Without edema. Neurologic:  Alert and  oriented x4;  grossly normal neurologically., NO  NEW FOCAL DEFICITS Cervical Nodes:  No significant cervical adenopathy. Psych:  AROUSABLE, FLAT affect.   Lab Results:  Recent Labs  12/25/15 1400 12/25/15 1949 12/26/15 0401  WBC 14.2* 14.2* 12.6*  HGB 14.8 12.9 11.5*  HCT 43.8 39.4 36.5  PLT 351 341 323   BMET  Recent Labs  12/25/15 1400  NA 136  K 3.9  CL 104  CO2 24  GLUCOSE 140*  BUN 20  CREATININE 0.75  CALCIUM 8.7*   LFT  Recent Labs  12/25/15 1400  PROT 8.1  ALBUMIN 3.1*  AST 55*  ALT 52  ALKPHOS 88  BILITOT 0.6     Studies/Results: ECG NOV 18 PACs, SINUS TACHYCARDIA   LOS: 1 day   Sandi Fields  12/26/2015, 9:17 AM

## 2015-12-27 DIAGNOSIS — K648 Other hemorrhoids: Secondary | ICD-10-CM | POA: Diagnosis not present

## 2015-12-27 DIAGNOSIS — K625 Hemorrhage of anus and rectum: Secondary | ICD-10-CM | POA: Diagnosis not present

## 2015-12-27 LAB — BASIC METABOLIC PANEL
Anion gap: 4 — ABNORMAL LOW (ref 5–15)
BUN: 16 mg/dL (ref 6–20)
CALCIUM: 7.8 mg/dL — AB (ref 8.9–10.3)
CO2: 26 mmol/L (ref 22–32)
CREATININE: 0.73 mg/dL (ref 0.44–1.00)
Chloride: 109 mmol/L (ref 101–111)
Glucose, Bld: 110 mg/dL — ABNORMAL HIGH (ref 65–99)
Potassium: 3.2 mmol/L — ABNORMAL LOW (ref 3.5–5.1)
SODIUM: 139 mmol/L (ref 135–145)

## 2015-12-27 LAB — CBC
HCT: 31.9 % — ABNORMAL LOW (ref 36.0–46.0)
HCT: 34.8 % — ABNORMAL LOW (ref 36.0–46.0)
Hemoglobin: 10.1 g/dL — ABNORMAL LOW (ref 12.0–15.0)
Hemoglobin: 11 g/dL — ABNORMAL LOW (ref 12.0–15.0)
MCH: 30 pg (ref 26.0–34.0)
MCH: 29.9 pg (ref 26.0–34.0)
MCHC: 31.7 g/dL (ref 30.0–36.0)
MCHC: 31.6 g/dL (ref 30.0–36.0)
MCV: 94.7 fL (ref 78.0–100.0)
MCV: 94.6 fL (ref 78.0–100.0)
PLATELETS: 241 10*3/uL (ref 150–400)
PLATELETS: 259 10*3/uL (ref 150–400)
RBC: 3.37 MIL/uL — ABNORMAL LOW (ref 3.87–5.11)
RBC: 3.68 MIL/uL — ABNORMAL LOW (ref 3.87–5.11)
RDW: 14.5 % (ref 11.5–15.5)
RDW: 14.3 % (ref 11.5–15.5)
WBC: 8.5 10*3/uL (ref 4.0–10.5)
WBC: 9.5 10*3/uL (ref 4.0–10.5)

## 2015-12-27 MED ORDER — LINACLOTIDE 145 MCG PO CAPS
145.0000 ug | ORAL_CAPSULE | Freq: Every day | ORAL | Status: DC
  Administered 2015-12-27 – 2015-12-28 (×2): 145 ug via ORAL
  Filled 2015-12-27 (×2): qty 1

## 2015-12-27 MED ORDER — CHLORHEXIDINE GLUCONATE CLOTH 2 % EX PADS
6.0000 | MEDICATED_PAD | Freq: Every day | CUTANEOUS | Status: DC
  Administered 2015-12-28: 6 via TOPICAL

## 2015-12-27 MED ORDER — MUPIROCIN 2 % EX OINT
1.0000 | TOPICAL_OINTMENT | Freq: Two times a day (BID) | CUTANEOUS | Status: DC
Start: 2015-12-27 — End: 2015-12-28
  Administered 2015-12-27 – 2015-12-28 (×3): 1 via NASAL
  Filled 2015-12-27: qty 22

## 2015-12-27 MED ORDER — PANTOPRAZOLE SODIUM 40 MG PO TBEC
40.0000 mg | DELAYED_RELEASE_TABLET | Freq: Every day | ORAL | Status: DC
  Administered 2015-12-27 – 2015-12-28 (×2): 40 mg via ORAL
  Filled 2015-12-27 (×2): qty 1

## 2015-12-27 NOTE — Progress Notes (Signed)
    Subjective: Laying in bed without distress. Unable to verbalize. Nursing states no report was given to them by night shift regarding any further rectal bleeding.   Objective: Vital signs in last 24 hours: Temp:  [98.2 F (36.8 C)-98.9 F (37.2 C)] 98.9 F (37.2 C) (11/20 0646) Pulse Rate:  [71-83] 83 (11/20 0646) Resp:  [14-18] 16 (11/20 0646) BP: (99-137)/(59-72) 124/72 (11/20 0646) SpO2:  [97 %-98 %] 98 % (11/20 0646) Last BM Date: 12/26/15 General:   Alert , non-verbal  Eyes:  No icterus, sclera clear. Conjuctiva pink.  Abdomen:  Bowel sounds present, soft, non-tender, non-distended. No HSM or hernias noted. No rebound or guarding. No masses appreciated  Rectal: external hemorrhoid tag noted, no obvious fissure, no evidence of thrombosed hemorrhoids, internal exam with small amount of soft, brown  stool in rectal vault and removed at time of exam. No rectal bleeding, no gross blood on exam. No obvious internal abnormalities and limited to soft stooling at time of exam.  Extremities: No edema   Intake/Output from previous day: 11/19 0701 - 11/20 0700 In: 120 [P.O.:120] Out: -  Intake/Output this shift: No intake/output data recorded.  Lab Results:  Recent Labs  12/26/15 1043 12/26/15 2234 12/27/15 0410  WBC 10.3 9.6 8.5  HGB 11.2* 10.7* 10.1*  HCT 35.3* 33.6* 31.9*  PLT 273 265 241   BMET  Recent Labs  12/25/15 1400 12/27/15 0410  NA 136 139  K 3.9 3.2*  CL 104 109  CO2 24 26  GLUCOSE 140* 110*  BUN 20 16  CREATININE 0.75 0.73  CALCIUM 8.7* 7.8*   LFT  Recent Labs  12/25/15 1400  PROT 8.1  ALBUMIN 3.1*  AST 55*  ALT 52  ALKPHOS 88  BILITOT 0.6    Assessment: 82 year old female admitted with rectal bleeding, not a candidate for colonoscopy due to decreased cognitive function and inability to take bowel prep. Flex sig considered if persistent rectal bleeding. Rectal exam notes hemorrhoidal tag, small amount of very soft, brown stool in rectal  vault that was digitally removed. No evidence of hard stool, rectal bleeding, no obvious internal abnormalities (limited by stooling at time of exam). Appears rectal bleeding has resolved. Hgb with slight drift likely dilutional. Hold on invasive measures for now and provide supportive care. Differentials including hemorrhoids, stercoral ulcers, less likely polys, AVMs, malignancy.     Plan: Continue scheduled milk of magnesia 30 ml BID Continue Senokot each evening  Monitor for diarrhea Anusol cream qid for 5-7 days  Monitor for any further rectal bleeding, follow H/H Hopeful discharge in next 24-48 hours if continues to do well  Gelene MinkAnna W. Damesha Lawler, ANP-BC [facility]     LOS: 2 days    12/27/2015, 8:01 AM

## 2015-12-27 NOTE — Progress Notes (Signed)
Subjective: No new problems noted. Her CODE STATUS has been changed to DO NOT RESUSCITATE which I think is appropriate. She's not really able to offer any complaints but does not seem to be in any distress  Objective: Vital signs in last 24 hours: Temp:  [98.2 F (36.8 C)-98.9 F (37.2 C)] 98.9 F (37.2 C) (11/20 0646) Pulse Rate:  [71-83] 83 (11/20 0646) Resp:  [14-18] 16 (11/20 0646) BP: (99-137)/(59-72) 124/72 (11/20 0646) SpO2:  [97 %-98 %] 98 % (11/20 0646) Weight change:  Last BM Date: 12/26/15  Intake/Output from previous day: 11/19 0701 - 11/20 0700 In: 120 [P.O.:120] Out: -   PHYSICAL EXAM General appearance: alert and no distress Resp: clear to auscultation bilaterally Cardio: regular rate and rhythm, S1, S2 normal, no murmur, click, rub or gallop GI: soft, non-tender; bowel sounds normal; no masses,  no organomegaly Extremities: extremities normal, atraumatic, no cyanosis or edema Skin warm and dry. Mucous membranes are moist  Lab Results:  Results for orders placed or performed during the hospital encounter of 12/25/15 (from the past 48 hour(s))  Comprehensive metabolic panel     Status: Abnormal   Collection Time: 12/25/15  2:00 PM  Result Value Ref Range   Sodium 136 135 - 145 mmol/L   Potassium 3.9 3.5 - 5.1 mmol/L   Chloride 104 101 - 111 mmol/L   CO2 24 22 - 32 mmol/L   Glucose, Bld 140 (H) 65 - 99 mg/dL   BUN 20 6 - 20 mg/dL   Creatinine, Ser 0.75 0.44 - 1.00 mg/dL   Calcium 8.7 (L) 8.9 - 10.3 mg/dL   Total Protein 8.1 6.5 - 8.1 g/dL   Albumin 3.1 (L) 3.5 - 5.0 g/dL   AST 55 (H) 15 - 41 U/L   ALT 52 14 - 54 U/L   Alkaline Phosphatase 88 38 - 126 U/L   Total Bilirubin 0.6 0.3 - 1.2 mg/dL   GFR calc non Af Amer >60 >60 mL/min   GFR calc Af Amer >60 >60 mL/min    Comment: (NOTE) The eGFR has been calculated using the CKD EPI equation. This calculation has not been validated in all clinical situations. eGFR's persistently <60 mL/min signify possible  Chronic Kidney Disease.    Anion gap 8 5 - 15  Lipase, blood     Status: None   Collection Time: 12/25/15  2:00 PM  Result Value Ref Range   Lipase 26 11 - 51 U/L  Troponin I     Status: None   Collection Time: 12/25/15  2:00 PM  Result Value Ref Range   Troponin I <0.03 <0.03 ng/mL  CBC with Differential     Status: Abnormal   Collection Time: 12/25/15  2:00 PM  Result Value Ref Range   WBC 14.2 (H) 4.0 - 10.5 K/uL   RBC 4.71 3.87 - 5.11 MIL/uL   Hemoglobin 14.8 12.0 - 15.0 g/dL   HCT 43.8 36.0 - 46.0 %   MCV 93.0 78.0 - 100.0 fL   MCH 31.4 26.0 - 34.0 pg   MCHC 33.8 30.0 - 36.0 g/dL   RDW 14.3 11.5 - 15.5 %   Platelets 351 150 - 400 K/uL   Neutrophils Relative % 79 %   Neutro Abs 11.2 (H) 1.7 - 7.7 K/uL   Lymphocytes Relative 12 %   Lymphs Abs 1.7 0.7 - 4.0 K/uL   Monocytes Relative 8 %   Monocytes Absolute 1.1 (H) 0.1 - 1.0 K/uL   Eosinophils Relative  1 %   Eosinophils Absolute 0.2 0.0 - 0.7 K/uL   Basophils Relative 0 %   Basophils Absolute 0.0 0.0 - 0.1 K/uL  Type and screen [facility]     Status: None   Collection Time: 12/25/15  2:00 PM  Result Value Ref Range   ABO/RH(D) A POS    Antibody Screen NEG    Sample Expiration 12/28/2015   Urinalysis, Routine w reflex microscopic     Status: Abnormal   Collection Time: 12/25/15  2:25 PM  Result Value Ref Range   Color, Urine AMBER (A) YELLOW    Comment: BIOCHEMICALS MAY BE AFFECTED BY COLOR   APPearance CLOUDY (A) CLEAR   Specific Gravity, Urine 1.020 1.005 - 1.030   pH 6.0 5.0 - 8.0   Glucose, UA NEGATIVE NEGATIVE mg/dL   Hgb urine dipstick MODERATE (A) NEGATIVE   Bilirubin Urine NEGATIVE NEGATIVE   Ketones, ur NEGATIVE NEGATIVE mg/dL   Protein, ur NEGATIVE NEGATIVE mg/dL   Nitrite NEGATIVE NEGATIVE   Leukocytes, UA LARGE (A) NEGATIVE  Urine microscopic-add on     Status: Abnormal   Collection Time: 12/25/15  2:25 PM  Result Value Ref Range   Squamous Epithelial / LPF 0-5 (A) NONE SEEN   WBC, UA  TOO NUMEROUS TO COUNT 0 - 5 WBC/hpf   RBC / HPF 6-30 0 - 5 RBC/hpf   Bacteria, UA MANY (A) NONE SEEN  CBC     Status: Abnormal   Collection Time: 12/25/15  7:49 PM  Result Value Ref Range   WBC 14.2 (H) 4.0 - 10.5 K/uL   RBC 4.24 3.87 - 5.11 MIL/uL   Hemoglobin 12.9 12.0 - 15.0 g/dL   HCT 39.4 36.0 - 46.0 %   MCV 92.9 78.0 - 100.0 fL   MCH 30.4 26.0 - 34.0 pg   MCHC 32.7 30.0 - 36.0 g/dL   RDW 14.4 11.5 - 15.5 %   Platelets 341 150 - 400 K/uL  MRSA PCR Screening     Status: Abnormal   Collection Time: 12/25/15  8:42 PM  Result Value Ref Range   MRSA by PCR POSITIVE (A) NEGATIVE    Comment:        The GeneXpert MRSA Assay (FDA approved for NASAL specimens only), is one component of a comprehensive MRSA colonization surveillance program. It is not intended to diagnose MRSA infection nor to guide or monitor treatment for MRSA infections. RESULT CALLED TO, READ BACK BY AND VERIFIED WITH: SULY,S AT 2326 ON 12/25/15 BY MOSLEY,J   CBC     Status: Abnormal   Collection Time: 12/26/15  4:01 AM  Result Value Ref Range   WBC 12.6 (H) 4.0 - 10.5 K/uL   RBC 3.87 3.87 - 5.11 MIL/uL   Hemoglobin 11.5 (L) 12.0 - 15.0 g/dL   HCT 36.5 36.0 - 46.0 %   MCV 94.3 78.0 - 100.0 fL   MCH 29.7 26.0 - 34.0 pg   MCHC 31.5 30.0 - 36.0 g/dL   RDW 14.5 11.5 - 15.5 %   Platelets 323 150 - 400 K/uL  CBC     Status: Abnormal   Collection Time: 12/26/15 10:43 AM  Result Value Ref Range   WBC 10.3 4.0 - 10.5 K/uL   RBC 3.75 (L) 3.87 - 5.11 MIL/uL   Hemoglobin 11.2 (L) 12.0 - 15.0 g/dL   HCT 35.3 (L) 36.0 - 46.0 %   MCV 94.1 78.0 - 100.0 fL   MCH 29.9 26.0 - 34.0  pg   MCHC 31.7 30.0 - 36.0 g/dL   RDW 14.3 11.5 - 15.5 %   Platelets 273 150 - 400 K/uL  CBC     Status: Abnormal   Collection Time: 12/26/15 10:34 PM  Result Value Ref Range   WBC 9.6 4.0 - 10.5 K/uL   RBC 3.56 (L) 3.87 - 5.11 MIL/uL   Hemoglobin 10.7 (L) 12.0 - 15.0 g/dL   HCT 33.6 (L) 36.0 - 46.0 %   MCV 94.4 78.0 - 100.0 fL    MCH 30.1 26.0 - 34.0 pg   MCHC 31.8 30.0 - 36.0 g/dL   RDW 14.3 11.5 - 15.5 %   Platelets 265 150 - 400 K/uL  CBC     Status: Abnormal   Collection Time: 12/27/15  4:10 AM  Result Value Ref Range   WBC 8.5 4.0 - 10.5 K/uL   RBC 3.37 (L) 3.87 - 5.11 MIL/uL   Hemoglobin 10.1 (L) 12.0 - 15.0 g/dL   HCT 31.9 (L) 36.0 - 46.0 %   MCV 94.7 78.0 - 100.0 fL   MCH 30.0 26.0 - 34.0 pg   MCHC 31.7 30.0 - 36.0 g/dL   RDW 14.5 11.5 - 15.5 %   Platelets 241 150 - 400 K/uL  Basic metabolic panel     Status: Abnormal   Collection Time: 12/27/15  4:10 AM  Result Value Ref Range   Sodium 139 135 - 145 mmol/L   Potassium 3.2 (L) 3.5 - 5.1 mmol/L    Comment: DELTA CHECK NOTED   Chloride 109 101 - 111 mmol/L   CO2 26 22 - 32 mmol/L   Glucose, Bld 110 (H) 65 - 99 mg/dL   BUN 16 6 - 20 mg/dL   Creatinine, Ser 0.73 0.44 - 1.00 mg/dL   Calcium 7.8 (L) 8.9 - 10.3 mg/dL   GFR calc non Af Amer >60 >60 mL/min   GFR calc Af Amer >60 >60 mL/min    Comment: (NOTE) The eGFR has been calculated using the CKD EPI equation. This calculation has not been validated in all clinical situations. eGFR's persistently <60 mL/min signify possible Chronic Kidney Disease.    Anion gap 4 (L) 5 - 15    ABGS No results for input(s): PHART, PO2ART, TCO2, HCO3 in the last 72 hours.  Invalid input(s): PCO2 CULTURES Recent Results (from the past 240 hour(s))  MRSA PCR Screening     Status: Abnormal   Collection Time: 12/25/15  8:42 PM  Result Value Ref Range Status   MRSA by PCR POSITIVE (A) NEGATIVE Final    Comment:        The GeneXpert MRSA Assay (FDA approved for NASAL specimens only), is one component of a comprehensive MRSA colonization surveillance program. It is not intended to diagnose MRSA infection nor to guide or monitor treatment for MRSA infections. RESULT CALLED TO, READ BACK BY AND VERIFIED WITH: SULY,S AT 2326 ON 12/25/15 BY MOSLEY,J    Studies/Results: No results found.  Medications:   Prior to Admission:  Prescriptions Prior to Admission  Medication Sig Dispense Refill Last Dose  . aspirin 81 MG chewable tablet Chew 81 mg by mouth daily.   12/24/2015 at Unknown time  . HYDROcodone-acetaminophen (NORCO/VICODIN) 5-325 MG tablet Take 1-2 tablets by mouth every 6 (six) hours as needed for moderate pain. 30 tablet 0 12/24/2015 at Unknown time  . levothyroxine (SYNTHROID, LEVOTHROID) 88 MCG tablet Take 88 mcg by mouth daily.     12/24/2015 at Unknown time  .  Melatonin 3 MG TABS Take 1 tablet by mouth at bedtime.   12/24/2015 at Unknown time  . omeprazole (PRILOSEC) 20 MG capsule Take 20 mg by mouth daily. on empty stomach   12/25/2015 at Unknown time  . Vitamin D, Ergocalciferol, (DRISDOL) 50000 units CAPS capsule Take 50,000 Units by mouth every 7 (seven) days.   Past Month at Unknown time  . nitroGLYCERIN (NITROSTAT) 0.4 MG SL tablet Place 0.4 mg under the tongue every 5 (five) minutes as needed for chest pain.    at unknown   Scheduled: . hydrocortisone   Rectal QID  . levofloxacin  250 mg Oral Daily  . levothyroxine  88 mcg Oral QAC breakfast  . magnesium hydroxide  30 mL Oral BID  . nystatin   Topical BID  . pantoprazole  40 mg Oral Q1200  . pantoprazole (PROTONIX) IV  40 mg Intravenous Q24H  . senna-docusate  1 tablet Oral QHS  . Vitamin D (Ergocalciferol)  50,000 Units Oral Q7 days   Continuous:  VEL:[id number], nitroGLYCERIN  Assesment: He is admitted with rectal bleeding. Her hemoglobin level has gone down. As noted by Dr. Oneida Alar she is not a candidate for colonoscopy. She does not appear to be having significant bleeding now in some of the change in her hemoglobin may be delutional. I don't think she is ready for transfer back to this nursing home and needs to have her hemoglobin level followed for at least another 24 hours. Active Problems:   Rectal bleeding   GI bleed    Plan: As above    LOS: 2 days   Samanyu Tinnell L 12/27/2015, 8:11  AM

## 2015-12-27 NOTE — Progress Notes (Addendum)
Initial Nutrition Assessment  INTERVENTION:  Ensure Enlive po BID, each supplement provides 350 kcal and 20 grams of protein   Dysphagia 2 diet with thin liquids  Needs assistance with feeding    NUTRITION DIAGNOSIS:  Inadequate oral intake related to cognitive impairment as evidenced by pt hx and meal intake 0-20%     GOAL:  Pt to meet >/= 90% of their estimated nutrition needs      MONITOR:  Po intake, labs and wt trends     REASON FOR ASSESSMENT:  Malnutrition Screen      ASSESSMENT: Patient is an 82 yo with hx of Alzheimer's dementia, dysphagia, hypokalemia, and major depression. [facility]. During RD visit ST is here and nursing staff is feeding [name]. She is unable to provide hx and no family are present. Poor meal intake 0-20% and pt eats very slowly taking small bites per NT. Weight hx reviewed and has fluctuated between 58-65 kg. Pt unable to report usual body weight. Physical exam not performed at this time. She is high risk for unwanted weight loss and malnutrition due to her dementia disease. Unsure what her GDS score is?     Recent Labs Lab 12/25/15 1400 12/27/15 0410  NA 136 139  K 3.9 3.2*  CL 104 109  CO2 24 26  BUN 20 16  CREATININE 0.75 0.73  CALCIUM 8.7* 7.8*  GLUCOSE 140* 110*   Labs: potassium 3.2   Meds: Vitmin D, PPI, sennokot-S, mag.-hydroxide  Diet Order:  DIET DYS 2 Room service appropriate? Yes; Fluid consistency: Thin  Skin:   skin tear   Last BM:   11/19 soft stool  Height:   Ht Readings from Last 1 Encounters:  12/25/15 5\' 6"  (1.676 m)    Weight:   Wt Readings from Last 1 Encounters:  12/25/15 128 lb 12.8 oz (58.4 kg)    Ideal Body Weight:   59kg  BMI:  Body mass index is 20.79 kg/m.normal  Estimated Nutritional Needs:   Kcal:   1550-1700   Protein:   70-75 gr  Fluid:   >1.5 liters daily  EDUCATION NEEDS: not appropriate given pt cognitive limitations    [name]  Kristin Derryberry MS,RD,CSG,LDN Office: [phone] Pager: [phone]

## 2015-12-27 NOTE — NC FL2 (Signed)
[place] MEDICAID FL2 LEVEL OF CARE SCREENING TOOL     IDENTIFICATION  Patient Name: [name] Birthdate: 08/21/1930 Sex: female Admission Date (Current Location): 12/25/2015  [place]:  [place] [id number] R Facility and Address:  [facility],  [address], [place] [id number]      Provider Number: [id number]  Attending Physician Name and Address:  Kari BaarsEdward Hawkins, MD  Relative Name and Phone Number:       Current Level of Care: Hospital Recommended Level of Care: Skilled Nursing Facility Prior Approval Number:    Date Approved/Denied:   PASRR Number: [id number]  Discharge Plan: SNF    Current Diagnoses: Patient Active Problem List   Diagnosis Date Noted  . Rectal bleeding 12/25/2015  . GI bleed 12/25/2015  . Malnutrition of moderate degree 11/09/2015  . Closed displaced fracture of left femoral neck (HCC) 11/07/2015  . Closed fracture of head of left humerus 11/07/2015  . Hip fracture (HCC) 11/07/2015  . Alzheimer's dementia 06/18/2012  . Altered mental status 06/16/2012  . Alzheimer's dementia without behavioral disturbance 10/15/2011  . Complicated UTI (urinary tract infection) 10/09/2011  . Acute renal failure (HCC) 10/09/2011  . GERD (gastroesophageal reflux disease) 10/09/2011  . Hypothyroidism 10/09/2011    Orientation RESPIRATION BLADDER Height & Weight      (disoriented x4)  Normal Incontinent Weight: 128 lb 12.8 oz (58.4 kg) Height:  5\' 6"  (167.6 cm)  BEHAVIORAL SYMPTOMS/MOOD NEUROLOGICAL BOWEL NUTRITION STATUS     (n/a) Incontinent Diet (Dysphagia 2 with thin liquids)  AMBULATORY STATUS COMMUNICATION OF NEEDS Skin   Total Care Verbally Other (Comment) (Skin tears. Rash to breasts.)                       Personal Care Assistance Level of Assistance  Bathing, Feeding, Dressing Bathing Assistance: Maximum assistance Feeding assistance: Maximum assistance Dressing Assistance: Maximum assistance      Functional Limitations Info  Sight, Hearing, Speech Sight Info: Adequate Hearing Info: Adequate Speech Info: Adequate    SPECIAL CARE FACTORS FREQUENCY                       Contractures      Additional Factors Info  Code Status, Allergies, Isolation Precautions Code Status Info: DNR Allergies Info: Penicillins     Isolation Precautions Info: 12/25/15 MRSA by PCR     Current Medications (12/27/2015):  This is the current hospital active medication list Current Facility-Administered Medications  Medication Dose Route Frequency Provider Last Rate Last Dose  . HYDROcodone-acetaminophen (NORCO/VICODIN) 5-325 MG per tablet 1 tablet  1 tablet Oral Q6H PRN Ejiroghene Wendall StadeE Emokpae, MD      . hydrocortisone (ANUSOL-HC) 2.5 % rectal cream   Rectal QID West BaliSandi L Fields, MD      . levofloxacin (LEVAQUIN) tablet 250 mg  250 mg Oral Daily Ejiroghene E Emokpae, MD      . levothyroxine (SYNTHROID, LEVOTHROID) tablet 88 mcg  88 mcg Oral QAC breakfast Ejiroghene E Emokpae, MD      . magnesium hydroxide (MILK OF MAGNESIA) suspension 30 mL  30 mL Oral BID [place] BaliSandi L Fields, MD      . nitroGLYCERIN (NITROSTAT) SL tablet 0.4 mg  0.4 mg Sublingual Q5 min PRN Ejiroghene E Emokpae, MD      . nystatin (MYCOSTATIN/NYSTOP) topical powder   Topical BID Kari BaarsEdward Hawkins, MD      . pantoprazole (PROTONIX) EC tablet 40 mg  40 mg Oral  [id number] Gelene MinkAnna W Boone, NP      . senna-docusate (Senokot-S) tablet 1 tablet  1 tablet Oral QHS Onnie BoerEjiroghene E Emokpae, MD   1 tablet at 12/26/15 2250  . Vitamin D (Ergocalciferol) (DRISDOL) capsule 50,000 Units  50,000 Units Oral Q7 days Kari BaarsEdward Hawkins, MD         Discharge Medications: Please see discharge summary for a list of discharge medications.  Relevant Imaging Results:  Relevant Lab Results:   Additional Information    Karn CassisStultz, Uyen Eichholz [place], [place] [phone]

## 2015-12-27 NOTE — Evaluation (Signed)
Clinical/Bedside Swallow Evaluation Patient Details  Name: [name] MRN: [id number] Date of Birth: 06/03/1930  Today's Date: 12/27/2015 Time: SLP Start Time (ACUTE ONLY): 1200 SLP Stop Time (ACUTE ONLY): 1220 SLP Time Calculation (min) (ACUTE ONLY): 20 min  Past Medical History:  Past Medical History:  Diagnosis Date  . Acid reflux   . Alzheimer's dementia   . Cognitive communication disorder   . Dementia   . Dysphagia   . Dysphagia   . Frequent falls   . Hypokalemia   . Lack of coordination   . Major depressive disorder   . Nondisplaced fracture    left humerus  . Peripheral vascular disorder (HCC)   . PVD (peripheral vascular disease) (HCC)   . Thyroid disease   . UTI (urinary tract infection)    Past Surgical History:  Past Surgical History:  Procedure Laterality Date  . HEMORROIDECTOMY    . HIP ARTHROPLASTY Left 11/09/2015   Procedure: ARTHROPLASTY BIPOLAR HIP (HEMIARTHROPLASTY);  Surgeon: Vickki HearingStanley E Harrison, MD;  Location: [facility];  Service: Orthopedics;  Laterality: Left;  0730   HPI:  Kristin Burnell BlanksM Sayer is a 82 y.o. female with h/o dementia brought in by ambulance who presents to the Emergency Department for evaluation of possible GI bleed this morning. [facility] where staff reported rectal bleeding upon diaper change this morning. Per NP at SNF, pts aide reported 2 large bright red blood clots the size of a grapefruit or orange. Facility reports that pt is at her baseline mental status. Facility denies fever, behavioral changes.    Assessment / Plan / Recommendation Clinical Impression  Pt seen at bedside for clinical swallow evaluation during lunch meal. CNA feeding pt lunch and reports good tolerance (no coughing or stuggle) with po intake. Pt confused and talking at times during po intake; mild lingual and dental residue with solids, however pt able to clear. Pt had some difficulty switching from straw to spoon/solids due to increased  cognitive demands. No current chest x-ray, pt afebrile and WBC WNL. Recommend continue D2/chopped diet (gravies and sauces to moisten meats) and thin liquids. OK for straw and po meds whole in puree. No further SLP services indicated. SLP will sign off.     Aspiration Risk  Mild aspiration risk    Diet Recommendation Dysphagia 2 (Fine chop);Thin liquid   Liquid Administration via: Cup;Straw Medication Administration: Whole meds with puree Supervision: Staff to assist with self feeding;Full supervision/cueing for compensatory strategies Compensations: Slow rate;Small sips/bites;Minimize environmental distractions Postural Changes: Seated upright at 90 degrees;Remain upright for at least 30 minutes after po intake    Other  Recommendations Oral Care Recommendations: Oral care BID;Staff/trained caregiver to provide oral care Other Recommendations: Clarify dietary restrictions   Follow up Recommendations 24 hour supervision/assistance      Frequency and Duration            Prognosis Prognosis for Safe Diet Advancement: Fair Barriers to Reach Goals: Cognitive deficits      Swallow Study   General Date of Onset: 12/25/15 HPI: [name] is a 82 y.o. female with h/o dementia brought in by ambulance who presents to the Emergency Department for evaluation of possible GI bleed this morning. [facility] where staff reported rectal bleeding upon diaper change this morning. Per NP at SNF, pts aide reported 2 large bright red blood clots the size of a grapefruit or orange. Facility reports that pt is at  her baseline mental status. Facility denies fever, behavioral changes.  Type of Study: Bedside Swallow Evaluation Previous Swallow Assessment: None on record Diet Prior to this Study: Dysphagia 2 (chopped);Thin liquids Temperature Spikes Noted: No Respiratory Status: Room air History of Recent Intubation: No Behavior/Cognition:  Alert;Cooperative;Confused;Requires cueing Oral Cavity Assessment: Within Functional Limits Oral Care Completed by SLP: No Oral Cavity - Dentition: Adequate natural dentition Vision: Functional for self-feeding Self-Feeding Abilities: Total assist Patient Positioning: Upright in bed Baseline Vocal Quality: Normal Volitional Cough: Cognitively unable to elicit Volitional Swallow: Unable to elicit    Oral/Motor/Sensory Function Overall Oral Motor/Sensory Function: Mild impairment (difficult to assess due to decreased cognition)   Ice Chips Ice chips: Not tested   Thin Liquid Thin Liquid: Within functional limits Presentation: Straw Other Comments:  (cues for intake)    Nectar Thick Nectar Thick Liquid: Not tested   Honey Thick Honey Thick Liquid: Not tested   Puree Puree: Within functional limits Presentation: Spoon   Solid   Thank you,  Havery MorosDabney Porter, CCC-SLP [phone]    Solid: Impaired Presentation: Spoon Oral Phase Impairments: Poor awareness of bolus Oral Phase Functional Implications: Prolonged oral transit;Oral residue        PORTER,DABNEY 12/27/2015,12:27 PM

## 2015-12-27 NOTE — Clinical Social Work Note (Signed)
Clinical Social Work Assessment  Patient Details  Name: [name] MRN: [id number] Date of Birth: 06/16/1930  Date of referral:  12/27/15               Reason for consult:  Discharge Planning                Permission sought to share information with:  Facility Industrial/product designerContact Representative Permission granted to share information::     Name::        Agency::  [facility]  Relationship::  facility  SolicitorContact Information:     Housing/Transportation Living arrangements for the past 2 months:  Skilled Building surveyorursing Facility Source of Information:  Adult Children, Facility Patient Interpreter Needed:  None Criminal Activity/Legal Involvement Pertinent to Current Situation/Hospitalization:  No - Comment as needed Significant Relationships:  Adult Children, Other Family Members Lives with:  Facility Resident Do you feel safe going back to the place where you live?  Yes Need for family participation in patient care:  Yes (Comment)  Care giving concerns:  Pt's son has some concerns about SNF. See below.    Social Worker assessment / plan:  CSW spoke with pt's son, Kristin Brownsnthony on phone. Pt is disoriented x 4 due to Alzheimer's dementia and pt's legal guardian, [name] does not have voicemail set up. Kristin Brownsnthony states that he is listed as secondary on guardianship as [name] is very hard to get in touch with. [facility]. She is on memory care unit and is nursing level of care. Pt had hip fracture in October and has been non-ambulatory since then per family and staff. CSW spoke with Kristin HerterShannon at facility and pt is okay to return.    Employment status:  Retired Database administratornsurance information:  Managed Medicare PT Recommendations:  Not assessed at this time Information / Referral to community resources:  Other (Comment Required) ([facility])  Patient/Family's Response to care:  Pt's son has some concerns about pt's care at facility. He has addressed these there and also  states he will contact the state if necessary. However, he requests [facility].   Patient/Family's Understanding of and Emotional Response to Diagnosis, Current Treatment, and Prognosis:  Pt's son is aware of admission diagnosis and spoke with MD this morning.   Emotional Assessment Appearance:  Appears stated age Attitude/Demeanor/Rapport:  Unable to Assess Affect (typically observed):  Unable to Assess Orientation:   (Disoriented x4) Alcohol / Substance use:  Not Applicable Psych involvement (Current and /or in the community):  No (Comment)  Discharge Needs  Concerns to be addressed:  Discharge Planning Concerns Readmission within the last 30 days:  No Current discharge risk:  Physical Impairment, Cognitively Impaired Barriers to Discharge:  Continued Medical Work up   Karn CassisStultz, Adriyana Greenbaum [place], LCSW 12/27/2015, 10:20 AM [phone]

## 2015-12-28 ENCOUNTER — Encounter: Payer: Self-pay | Admitting: Gastroenterology

## 2015-12-28 ENCOUNTER — Telehealth: Payer: Self-pay | Admitting: Gastroenterology

## 2015-12-28 DIAGNOSIS — K648 Other hemorrhoids: Secondary | ICD-10-CM | POA: Diagnosis not present

## 2015-12-28 DIAGNOSIS — K625 Hemorrhage of anus and rectum: Secondary | ICD-10-CM | POA: Diagnosis not present

## 2015-12-28 DIAGNOSIS — D62 Acute posthemorrhagic anemia: Secondary | ICD-10-CM | POA: Diagnosis present

## 2015-12-28 LAB — CBC WITH DIFFERENTIAL/PLATELET
BASOS PCT: 0 %
Basophils Absolute: 0 10*3/uL (ref 0.0–0.1)
EOS ABS: 0.2 10*3/uL (ref 0.0–0.7)
EOS PCT: 2 %
HCT: 35.2 % — ABNORMAL LOW (ref 36.0–46.0)
Hemoglobin: 11.1 g/dL — ABNORMAL LOW (ref 12.0–15.0)
LYMPHS ABS: 1.4 10*3/uL (ref 0.7–4.0)
Lymphocytes Relative: 14 %
MCH: 29.9 pg (ref 26.0–34.0)
MCHC: 31.5 g/dL (ref 30.0–36.0)
MCV: 94.9 fL (ref 78.0–100.0)
MONO ABS: 0.5 10*3/uL (ref 0.1–1.0)
MONOS PCT: 5 %
NEUTROS PCT: 79 %
Neutro Abs: 7.7 10*3/uL (ref 1.7–7.7)
PLATELETS: 296 10*3/uL (ref 150–400)
RBC: 3.71 MIL/uL — ABNORMAL LOW (ref 3.87–5.11)
RDW: 14.5 % (ref 11.5–15.5)
WBC: 9.8 10*3/uL (ref 4.0–10.5)

## 2015-12-28 MED ORDER — LINACLOTIDE 145 MCG PO CAPS: 145.0000 ug | ORAL_CAPSULE | Freq: Every day | ORAL | 12 refills | Status: DC

## 2015-12-28 MED ORDER — SENNOSIDES-DOCUSATE SODIUM 8.6-50 MG PO TABS: 1.0000 | ORAL_TABLET | Freq: Every day | ORAL | 12 refills | Status: DC

## 2015-12-28 NOTE — Clinical Social Work Note (Signed)
[facility]. Pt's granddaughter/guardian aware and agreeable as well as facility. Pt will transport via facility van.  Derenda FennelKara Merlon Alcorta, LCSW [phone]

## 2015-12-28 NOTE — Telephone Encounter (Signed)
Please arrange a hospital follow-up in 6-8 weeks.

## 2015-12-28 NOTE — Discharge Summary (Signed)
Physician Discharge Summary  Patient ID: [name] MRN: [id number] DOB/AGE: 80/09/1930 80 y.o. Primary Care Physician:Kristin Hosick L, MD Admit date: 12/25/2015 Discharge date: 12/28/2015    Discharge Diagnoses:   Active Problems:   GERD (gastroesophageal reflux disease)   Alzheimer's dementia without behavioral disturbance   Rectal bleeding   GI bleed   Acute blood loss anemia     Medication List    STOP taking these medications   aspirin 81 MG chewable tablet     TAKE these medications   HYDROcodone-acetaminophen 5-325 MG tablet Commonly known as:  NORCO/VICODIN Take 1-2 tablets by mouth every 6 (six) hours as needed for moderate pain.   levothyroxine 88 MCG tablet Commonly known as:  SYNTHROID, LEVOTHROID Take 88 mcg by mouth daily.   linaclotide 145 MCG Caps capsule Commonly known as:  LINZESS Take 1 capsule (145 mcg total) by mouth daily before breakfast. Start taking on:  12/29/2015   Melatonin 3 MG Tabs Take 1 tablet by mouth at bedtime.   nitroGLYCERIN 0.4 MG SL tablet Commonly known as:  NITROSTAT Place 0.4 mg under the tongue every 5 (five) minutes as needed for chest pain.   omeprazole 20 MG capsule Commonly known as:  PRILOSEC Take 20 mg by mouth daily. on empty stomach   senna-docusate 8.6-50 MG tablet Commonly known as:  Senokot-S Take 1 tablet by mouth at bedtime.   Vitamin D (Ergocalciferol) 50000 units Caps capsule Commonly known as:  DRISDOL Take 50,000 Units by mouth every 7 (seven) days.       Discharged Condition:Improved    Consults: GI, Dr. Darrick PennaFields  Significant Diagnostic Studies: No results found.  Lab Results: Basic Metabolic Panel:  Recent Labs  40/98/1111/18/17 1400 12/27/15 0410  NA 136 139  K 3.9 3.2*  CL 104 109  CO2 24 26  GLUCOSE 140* 110*  BUN 20 16  CREATININE 0.75 0.73  CALCIUM 8.7* 7.8*   Liver Function Tests:  Recent Labs  12/25/15 1400  AST 55*  ALT 52  ALKPHOS 88  BILITOT 0.6  PROT 8.1   ALBUMIN 3.1*     CBC:  Recent Labs  12/25/15 1400  12/27/15 0410 12/27/15 1129  WBC 14.2*  < > 8.5 9.5  NEUTROABS 11.2*  --   --   --   HGB 14.8  < > 10.1* 11.0*  HCT 43.8  < > 31.9* 34.8*  MCV 93.0  < > 94.7 94.6  PLT 351  < > 241 259  < > = values in this interval not displayed.  Recent Results (from the past 240 hour(s))  MRSA PCR Screening     Status: Abnormal   Collection Time: 12/25/15  8:42 PM  Result Value Ref Range Status   MRSA by PCR POSITIVE (A) NEGATIVE Final    Comment:        The GeneXpert MRSA Assay (FDA approved for NASAL specimens only), is one component of a comprehensive MRSA colonization surveillance program. It is not intended to diagnose MRSA infection nor to guide or monitor treatment for MRSA infections. RESULT CALLED TO, READ BACK BY AND VERIFIED WITH: SULY,S AT 2326 ON 12/25/15 BY [facility] CenterMOSLEY,J      Hospital Course: This is an 82 year old who lives at a skilled care facility and who was in her usual state of poor health when she developed rectal bleeding. This appeared to be substantial and she was brought to the emergency department. She was found to have heme positive stools. She had some hemorrhoids.  She was also impacted. Her bleeding stopped almost immediately after admission. She had some equilibration of her hemoglobin level and was mildly anemic. GI consultation was obtained it was felt that she did not need any procedures while in the hospital. She was back to baseline at time of discharge.  Discharge Exam: Blood pressure 117/68, pulse 74, temperature 98.2 F (36.8 C), temperature source Oral, resp. rate 16, height 5\' 6"  (1.676 m), weight 58.4 kg (128 lb 12.8 oz), SpO2 96 %. Alert but confused. Skin warm and dry. Chest clear  Disposition: Return to skilled care facility. She needs physical therapy occupational therapy and potentially speech therapy. She has had a hip fracture and has not been able to ambulate so physical therapy will be  needed to help with that. She will be on dysphagia 2 diet and thin liquids  Discharge Instructions    Discharge to SNF when bed available    Complete by:  As directed         Signed: Reaghan Velasquez Velasquez   12/28/2015, 8:10 AM

## 2015-12-28 NOTE — Progress Notes (Signed)
[facility] called and given to Areatha KeasBrandy Richardson RN. Vital signs stable.Staff accompanied patient to awaiting vehicle.

## 2015-12-28 NOTE — Progress Notes (Signed)
Subjective: No further bleeding noted.  Objective: Vital signs in last 24 hours: Temp:  [98.2 F (36.8 C)-98.4 F (36.9 C)] 98.2 F (36.8 C) (11/21 0532) Pulse Rate:  [74-83] 74 (11/21 0532) Resp:  [16-20] 16 (11/21 0532) BP: (104-118)/(62-85) 117/68 (11/21 0532) SpO2:  [92 %-100 %] 96 % (11/21 0532) Weight change:  Last BM Date: 12/27/15  Intake/Output from previous day: 11/20 0701 - 11/21 0700 In: 60 [P.O.:60] Out: -   PHYSICAL EXAM General appearance: Awake and responsive but confused Resp: clear to auscultation bilaterally Cardio: regular rate and rhythm, S1, S2 normal, no murmur, click, rub or gallop GI: soft, non-tender; bowel sounds normal; no masses,  no organomegaly Extremities: extremities normal, atraumatic, no cyanosis or edema Skin warm and dry. Mucous membranes are moist  Lab Results:  Results for orders placed or performed during the hospital encounter of 12/25/15 (from the past 48 hour(s))  CBC     Status: Abnormal   Collection Time: 12/26/15 10:43 AM  Result Value Ref Range   WBC 10.3 4.0 - 10.5 K/uL   RBC 3.75 (L) 3.87 - 5.11 MIL/uL   Hemoglobin 11.2 (L) 12.0 - 15.0 g/dL   HCT 35.3 (L) 36.0 - 46.0 %   MCV 94.1 78.0 - 100.0 fL   MCH 29.9 26.0 - 34.0 pg   MCHC 31.7 30.0 - 36.0 g/dL   RDW 14.3 11.5 - 15.5 %   Platelets 273 150 - 400 K/uL  CBC     Status: Abnormal   Collection Time: 12/26/15 10:34 PM  Result Value Ref Range   WBC 9.6 4.0 - 10.5 K/uL   RBC 3.56 (L) 3.87 - 5.11 MIL/uL   Hemoglobin 10.7 (L) 12.0 - 15.0 g/dL   HCT 33.6 (L) 36.0 - 46.0 %   MCV 94.4 78.0 - 100.0 fL   MCH 30.1 26.0 - 34.0 pg   MCHC 31.8 30.0 - 36.0 g/dL   RDW 14.3 11.5 - 15.5 %   Platelets 265 150 - 400 K/uL  CBC     Status: Abnormal   Collection Time: 12/27/15  4:10 AM  Result Value Ref Range   WBC 8.5 4.0 - 10.5 K/uL   RBC 3.37 (L) 3.87 - 5.11 MIL/uL   Hemoglobin 10.1 (L) 12.0 - 15.0 g/dL   HCT 31.9 (L) 36.0 - 46.0 %   MCV 94.7 78.0 - 100.0 fL   MCH 30.0 26.0 -  34.0 pg   MCHC 31.7 30.0 - 36.0 g/dL   RDW 14.5 11.5 - 15.5 %   Platelets 241 150 - 400 K/uL  Basic metabolic panel     Status: Abnormal   Collection Time: 12/27/15  4:10 AM  Result Value Ref Range   Sodium 139 135 - 145 mmol/L   Potassium 3.2 (L) 3.5 - 5.1 mmol/L    Comment: DELTA CHECK NOTED   Chloride 109 101 - 111 mmol/L   CO2 26 22 - 32 mmol/L   Glucose, Bld 110 (H) 65 - 99 mg/dL   BUN 16 6 - 20 mg/dL   Creatinine, Ser 0.73 0.44 - 1.00 mg/dL   Calcium 7.8 (L) 8.9 - 10.3 mg/dL   GFR calc non Af Amer >60 >60 mL/min   GFR calc Af Amer >60 >60 mL/min    Comment: (NOTE) The eGFR has been calculated using the CKD EPI equation. This calculation has not been validated in all clinical situations. eGFR's persistently <60 mL/min signify possible Chronic Kidney Disease.    Anion gap 4 (  L) 5 - 15  CBC     Status: Abnormal   Collection Time: 12/27/15 11:29 AM  Result Value Ref Range   WBC 9.5 4.0 - 10.5 K/uL   RBC 3.68 (L) 3.87 - 5.11 MIL/uL   Hemoglobin 11.0 (L) 12.0 - 15.0 g/dL   HCT 34.8 (L) 36.0 - 46.0 %   MCV 94.6 78.0 - 100.0 fL   MCH 29.9 26.0 - 34.0 pg   MCHC 31.6 30.0 - 36.0 g/dL   RDW 14.3 11.5 - 15.5 %   Platelets 259 150 - 400 K/uL    ABGS No results for input(s): PHART, PO2ART, TCO2, HCO3 in the last 72 hours.  Invalid input(s): PCO2 CULTURES Recent Results (from the past 240 hour(s))  MRSA PCR Screening     Status: Abnormal   Collection Time: 12/25/15  8:42 PM  Result Value Ref Range Status   MRSA by PCR POSITIVE (A) NEGATIVE Final    Comment:        The GeneXpert MRSA Assay (FDA approved for NASAL specimens only), is one component of a comprehensive MRSA colonization surveillance program. It is not intended to diagnose MRSA infection nor to guide or monitor treatment for MRSA infections. RESULT CALLED TO, READ BACK BY AND VERIFIED WITH: SULY,S AT 2326 ON 12/25/15 BY MOSLEY,J    Studies/Results: No results found.  Medications:  Prior to  Admission:  Prescriptions Prior to Admission  Medication Sig Dispense Refill Last Dose  . aspirin 81 MG chewable tablet Chew 81 mg by mouth daily.   12/24/2015 at Unknown time  . HYDROcodone-acetaminophen (NORCO/VICODIN) 5-325 MG tablet Take 1-2 tablets by mouth every 6 (six) hours as needed for moderate pain. 30 tablet 0 12/24/2015 at Unknown time  . levothyroxine (SYNTHROID, LEVOTHROID) 88 MCG tablet Take 88 mcg by mouth daily.     12/24/2015 at Unknown time  . Melatonin 3 MG TABS Take 1 tablet by mouth at bedtime.   12/24/2015 at Unknown time  . omeprazole (PRILOSEC) 20 MG capsule Take 20 mg by mouth daily. on empty stomach   12/25/2015 at Unknown time  . Vitamin D, Ergocalciferol, (DRISDOL) 50000 units CAPS capsule Take 50,000 Units by mouth every 7 (seven) days.   Past Month at Unknown time  . nitroGLYCERIN (NITROSTAT) 0.4 MG SL tablet Place 0.4 mg under the tongue every 5 (five) minutes as needed for chest pain.    at unknown   Scheduled: . Chlorhexidine Gluconate Cloth  6 each Topical Q0600  . hydrocortisone   Rectal QID  . levofloxacin  250 mg Oral Daily  . levothyroxine  88 mcg Oral QAC breakfast  . linaclotide  145 mcg Oral QAC breakfast  . magnesium hydroxide  30 mL Oral BID  . mupirocin ointment  1 application Nasal BID  . nystatin   Topical BID  . pantoprazole  40 mg Oral Q1200  . senna-docusate  1 tablet Oral QHS  . Vitamin D (Ergocalciferol)  50,000 Units Oral Q7 days   Continuous:  IRW:[id number], nitroGLYCERIN  Assesment: She was admitted with rectal bleeding likely multi-factorial. There is some element of a hemorrhoid. She also seemed to be impacted. She has improved. She did not need a blood transfusion. No in-hospital workup is planned so I think she can return to her skilled care facility Active Problems:   Rectal bleeding   GI bleed    Plan: Discharge back to skilled care facility    LOS: 3 days   Estel Tonelli L 12/28/2015, 8:05 AM

## 2015-12-28 NOTE — Telephone Encounter (Signed)
APPT MADE AND LETTER SENT  °

## 2015-12-29 LAB — URINE CULTURE

## 2016-01-25 ENCOUNTER — Ambulatory Visit: Payer: Medicare Other | Admitting: Orthopedic Surgery

## 2016-01-28 ENCOUNTER — Ambulatory Visit (INDEPENDENT_AMBULATORY_CARE_PROVIDER_SITE_OTHER): Payer: Medicare Other | Admitting: Orthopedic Surgery

## 2016-01-28 ENCOUNTER — Encounter: Payer: Self-pay | Admitting: Orthopedic Surgery

## 2016-01-28 DIAGNOSIS — S42295D Other nondisplaced fracture of upper end of left humerus, subsequent encounter for fracture with routine healing: Secondary | ICD-10-CM

## 2016-01-28 DIAGNOSIS — Z4889 Encounter for other specified surgical aftercare: Secondary | ICD-10-CM

## 2016-01-28 NOTE — Progress Notes (Signed)
Patient ID: Kristin AlvineDixie M Velasquez, female   DOB: 10/08/1930, 82 y.o.   MRN: [id number]  Chief Complaint  Patient presents with  . Follow-up    LEFT BIPOLAR HIP 11/09/15    HPI [name] is a 82 y.o. female.   HPI  [name] had a bipolar hip replacement for left hip fracture. She is significantly confused  Review of Systems Review of Systems  Noncontributory   Physical Exam  Leg lengths are equal leg alignment is normal hip flexion 90   MEDICAL DECISION MAKING  DATA   No additional data  DIAGNOSIS  Encounter Diagnoses  Name Primary?  . Other closed nondisplaced fracture of proximal end of left humerus with routine healing, subsequent encounter Yes  . Aftercare following surgery     PLAN(RISK)    Recommend physical therapy follow-up as needed

## 2016-02-16 ENCOUNTER — Ambulatory Visit (INDEPENDENT_AMBULATORY_CARE_PROVIDER_SITE_OTHER): Payer: Medicare Other | Admitting: Gastroenterology

## 2016-02-16 ENCOUNTER — Encounter: Payer: Self-pay | Admitting: Gastroenterology

## 2016-02-16 VITALS — BP 111/69 | HR 79 | Temp 98.0°F | Ht 61.0 in | Wt 127.0 lb

## 2016-02-16 DIAGNOSIS — K625 Hemorrhage of anus and rectum: Secondary | ICD-10-CM | POA: Diagnosis not present

## 2016-02-16 NOTE — Assessment & Plan Note (Signed)
Hospitalized with rectal bleeding likely secondary to hemorrhoids, stercoral ulcer, less likely other etiology such as AVMs, polyps, malignancy. Per nursing supervisor at facility, no further rectal bleeding or issues with constipation. Will recheck CBC for stability. Follow-up prn.

## 2016-02-16 NOTE — Progress Notes (Signed)
Referring Provider: Kari Baars, MD Primary Care Physician:  Fredirick Maudlin, MD Primary GI: Dr. Jena Gauss   Chief Complaint  Patient presents with  . Rectal Bleeding    HPI:   [name] is a 83 y.o. female presenting today in hospital follow-up after admission for rectal bleeding in Nov 2017. Not a candidate for colonoscopy at time of admission due to decreased cognitive function and inability to take bowel prep. Differentials included hemorrhoids, stercoral ulcers, less likely polyps, AVMs, malignancy. At discharge, Hgb 11.1.   Patient is unable to give any information due to cognitive status. Pleasant staff worker present but not familiar with patient. I contacted [facility] and spoke with the supervisor who states no further rectal bleeding, no changes in bowel habits. No concerning GI issues.       Past Medical History:  Diagnosis Date  . Acid reflux   . Alzheimer's dementia   . Cognitive communication disorder   . Dementia   . Dysphagia   . Dysphagia   . Frequent falls   . Hypokalemia   . Lack of coordination   . Major depressive disorder   . Nondisplaced fracture    left humerus  . Peripheral vascular disorder (HCC)   . PVD (peripheral vascular disease) (HCC)   . Thyroid disease   . UTI (urinary tract infection)     Past Surgical History:  Procedure Laterality Date  . HEMORROIDECTOMY    . HIP ARTHROPLASTY Left 11/09/2015   Procedure: ARTHROPLASTY BIPOLAR HIP (HEMIARTHROPLASTY);  Surgeon: Vickki Hearing, MD;  Location: [facility];  Service: Orthopedics;  Laterality: Left;  0730    Current Outpatient Prescriptions  Medication Sig Dispense Refill  . docusate sodium (COLACE) 100 MG capsule Take 100 mg by mouth 2 (two) times daily.    Marland Kitchen HYDROcodone-acetaminophen (NORCO/VICODIN) 5-325 MG tablet Take 1-2 tablets by mouth every 6 (six) hours as needed for moderate pain. 30 tablet 0  . levothyroxine (SYNTHROID, LEVOTHROID) 88 MCG tablet Take 88 mcg by mouth  daily.      . Melatonin 3 MG TABS Take 1 tablet by mouth at bedtime.    . nitroGLYCERIN (NITROSTAT) 0.4 MG SL tablet Place 0.4 mg under the tongue every 5 (five) minutes as needed for chest pain.    Marland Kitchen omeprazole (PRILOSEC) 20 MG capsule Take 20 mg by mouth daily. on empty stomach    . Vitamin D, Ergocalciferol, (DRISDOL) 50000 units CAPS capsule Take 50,000 Units by mouth every 7 (seven) days.     No current facility-administered medications for this visit.     Allergies as of 02/16/2016 - Review Complete 02/16/2016  Allergen Reaction Noted  . Penicillins  10/08/2011    No family history on file.  Social History   Social History  . Marital status: Widowed    Spouse name: N/A  . Number of children: N/A  . Years of education: N/A   Social History Main Topics  . Smoking status: Never Smoker  . Smokeless tobacco: Never Used  . Alcohol use No  . Drug use: No  . Sexual activity: Not Currently   Other Topics Concern  . None   Social History Narrative  . None    Review of Systems: Unable to obtain due to cognitive status   Physical Exam: BP 111/69   Pulse 79   Temp 98 F (36.7 C) (Oral)   Ht 5\' 1"  (1.549 m)   Wt 127 lb (57.6 kg)   BMI 24.00 kg/m  General:   Resting, snoring. Caretaker states she just fell asleep. Briefly awakened to verbal stimuli and said "yes mam", then quickly fell asleep.  Abdomen:  +BS, soft, non-tender and non-distended. Limited exam with patient in chair.  Extremities:  Thin, no edema  Neurologic:  Unable to assess    Lab Results  Component Value Date   WBC 9.8 12/28/2015   HGB 11.1 (L) 12/28/2015   HCT 35.2 (L) 12/28/2015   MCV 94.9 12/28/2015   PLT 296 12/28/2015

## 2016-02-16 NOTE — Patient Instructions (Signed)
Please have blood work done and faxed to our office.   We will see you back as needed.  Avoid constipation.

## 2016-02-17 NOTE — Progress Notes (Signed)
cc'ed to pcp °

## 2016-09-06 DEATH — deceased

## 2017-06-08 IMAGING — CR DG PORTABLE PELVIS
1 series · 1 of 1 positions shown · non-contrast
Comparison: 11/07/2015

CLINICAL DATA: Left hip arthroplasty

EXAM:
PORTABLE PELVIS 1-2 VIEWS

[outlet]
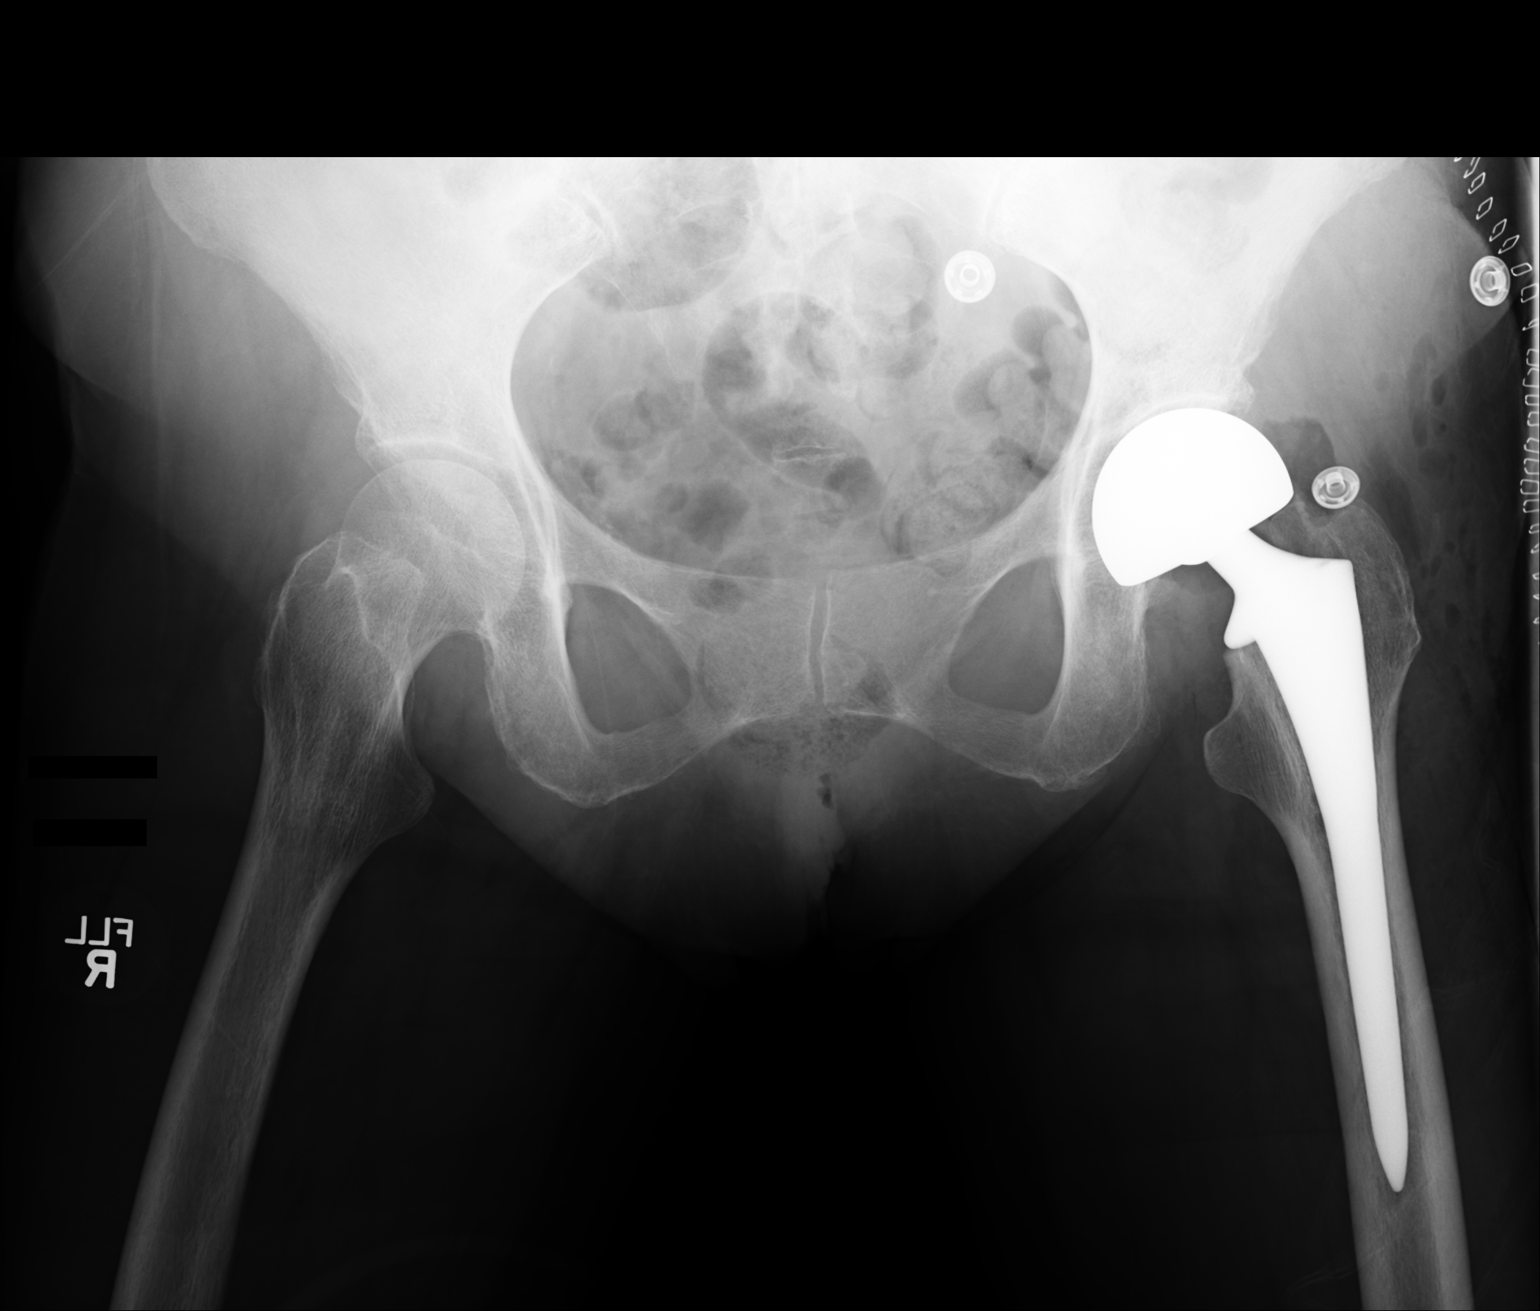

[1 of 1 positions shown; findings below may reference images not displayed]

FINDINGS: Left hip hemi arthroplasty has been placed. Anatomic alignment. No
breakage for loosening of the hardware. Osteopenia. Right hip joint
space is well maintained.
IMPRESSION: Left hip hemi arthroplasty anatomically aligned.
